# Patient Record
Sex: Female | Born: 1990 | Race: Black or African American | Hispanic: No | Marital: Married | Smoking: Current every day smoker
Health system: Southern US, Community
[De-identification: ages and names within clinical notes are randomized; demographics above are authoritative.]

## PROBLEM LIST (undated history)

## (undated) DIAGNOSIS — O139 Gestational [pregnancy-induced] hypertension without significant proteinuria, unspecified trimester: Secondary | ICD-10-CM

## (undated) DIAGNOSIS — E079 Disorder of thyroid, unspecified: Secondary | ICD-10-CM

## (undated) DIAGNOSIS — E669 Obesity, unspecified: Secondary | ICD-10-CM

## (undated) DIAGNOSIS — O169 Unspecified maternal hypertension, unspecified trimester: Secondary | ICD-10-CM

## (undated) DIAGNOSIS — I10 Essential (primary) hypertension: Secondary | ICD-10-CM

## (undated) HISTORY — PX: UMBILICAL HERNIA REPAIR: SHX196

## (undated) HISTORY — PX: HERNIA REPAIR: SHX51

---
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
## 2017-06-13 ENCOUNTER — Emergency Department
Admission: EM | Admit: 2017-06-13 | Discharge: 2017-06-13 | Disposition: A | Discharge status: Home / Self Care | Payer: Medicaid Other | Attending: Emergency Medicine | Admitting: Emergency Medicine

## 2017-06-13 DIAGNOSIS — Y92410 Unspecified street and highway as the place of occurrence of the external cause: Secondary | ICD-10-CM | POA: Insufficient documentation

## 2017-06-13 DIAGNOSIS — Y939 Activity, unspecified: Secondary | ICD-10-CM | POA: Insufficient documentation

## 2017-06-13 DIAGNOSIS — Z3A09 9 weeks gestation of pregnancy: Secondary | ICD-10-CM | POA: Diagnosis not present

## 2017-06-13 DIAGNOSIS — M545 Low back pain: Secondary | ICD-10-CM | POA: Diagnosis present

## 2017-06-13 DIAGNOSIS — Z041 Encounter for examination and observation following transport accident: Secondary | ICD-10-CM | POA: Insufficient documentation

## 2017-06-13 DIAGNOSIS — Y999 Unspecified external cause status: Secondary | ICD-10-CM | POA: Diagnosis not present

## 2017-06-13 NOTE — ED Notes (Signed)
Pt asked to use phone. Instructed there is a phone in lobby over by officer. Pt ambulated from flex wait to main wait without any distress noted.
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
## 2017-06-13 NOTE — ED Notes (Signed)
Pt alert and oriented X4, active, cooperative, pt in NAD. RR even and unlabored, color WNL.  Pt informed to return if any life threatening symptoms occur.   
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
## 2017-09-07 DIAGNOSIS — E0590 Thyrotoxicosis, unspecified without thyrotoxic crisis or storm: Secondary | ICD-10-CM | POA: Insufficient documentation

## 2017-10-25 DIAGNOSIS — F53 Postpartum depression: Secondary | ICD-10-CM

## 2017-10-25 DIAGNOSIS — O1495 Unspecified pre-eclampsia, complicating the puerperium: Secondary | ICD-10-CM

## 2017-10-25 HISTORY — DX: Postpartum depression: F53.0

## 2017-10-25 HISTORY — DX: Unspecified pre-eclampsia, complicating the puerperium: O14.95

## 2017-11-18 DIAGNOSIS — R519 Headache, unspecified: Secondary | ICD-10-CM | POA: Insufficient documentation

## 2017-12-02 DIAGNOSIS — E221 Hyperprolactinemia: Secondary | ICD-10-CM | POA: Insufficient documentation

## 2017-12-29 DIAGNOSIS — O163 Unspecified maternal hypertension, third trimester: Secondary | ICD-10-CM | POA: Insufficient documentation

## 2017-12-30 DIAGNOSIS — O1490 Unspecified pre-eclampsia, unspecified trimester: Secondary | ICD-10-CM

## 2018-09-16 ENCOUNTER — Emergency Department
Admission: EM | Admit: 2018-09-16 | Discharge: 2018-09-16 | Disposition: A | Discharge status: Home / Self Care | Payer: Medicaid Other | Attending: Emergency Medicine | Admitting: Emergency Medicine

## 2018-09-16 ENCOUNTER — Other Ambulatory Visit: Payer: Self-pay

## 2018-09-16 ENCOUNTER — Encounter: Payer: Self-pay | Admitting: Emergency Medicine

## 2018-09-16 DIAGNOSIS — R2232 Localized swelling, mass and lump, left upper limb: Secondary | ICD-10-CM | POA: Diagnosis present

## 2018-09-16 DIAGNOSIS — B9562 Methicillin resistant Staphylococcus aureus infection as the cause of diseases classified elsewhere: Secondary | ICD-10-CM | POA: Diagnosis not present

## 2018-09-16 DIAGNOSIS — A4902 Methicillin resistant Staphylococcus aureus infection, unspecified site: Secondary | ICD-10-CM

## 2018-09-16 DIAGNOSIS — L02412 Cutaneous abscess of left axilla: Secondary | ICD-10-CM | POA: Diagnosis not present

## 2018-09-16 LAB — GLUCOSE, CAPILLARY: Glucose-Capillary: 113 mg/dL — ABNORMAL HIGH (ref 70–99)

## 2018-09-16 MED ORDER — MUPIROCIN 2 % EX OINT: TOPICAL_OINTMENT | CUTANEOUS | 0 refills | Status: DC

## 2018-09-16 MED ORDER — LIDOCAINE-EPINEPHRINE 2 %-1:100000 IJ SOLN
20.0000 mL | Freq: Once | INTRAMUSCULAR | Status: AC
  Administered 2018-09-16: 20 mL via INTRADERMAL

## 2018-09-16 MED ORDER — LIDOCAINE-EPINEPHRINE 2 %-1:100000 IJ SOLN
INTRAMUSCULAR | Status: AC
  Administered 2018-09-16: 20 mL via INTRADERMAL
  Filled 2018-09-16: qty 1

## 2018-09-16 MED ORDER — IBUPROFEN 600 MG PO TABS: 600.0000 mg | ORAL_TABLET | Freq: Three times a day (TID) | ORAL | 0 refills | Status: DC | PRN

## 2018-09-16 NOTE — Discharge Instructions (Signed)
Please purchase over the counter HIBICLENS and wash with it twice over your entire body over the next week.  Also put the Bactroban ointment in both of your nostrils twice a day for the next 5 days to help eradicate the staph infection from your body.  Follow-up with your primary care physician this coming Monday for recheck and return to the emergency department sooner for any concerns.  It was a pleasure to take care of you today, and thank you for coming to our emergency department.  If you have any questions or concerns before leaving please ask the nurse to grab me and I'm more than happy to go through your aftercare instructions again.  If you have any concerns once you are home that you are not improving or are in fact getting worse before you can make it to your follow-up appointment, please do not hesitate to call 911 and come back for further evaluation.  Merrily BrittleNeil Charmayne Odell, MD

## 2018-09-16 NOTE — ED Provider Notes (Signed)
[facility] Emergency Department Provider Note  ____________________________________________   First MD Initiated Contact with Patient 09/16/18 0119     (approximate)  I have reviewed the triage vital signs and the nursing notes.   HISTORY  Chief Complaint Abscess   HPI Janice Mccall is a 27 y.o. female Janice Mccall presents the emergency department with painful swelling to her left axilla for the past several days.  Symptoms were gradual onset slowly progressive are now moderate to severe.  Pain is worse with movement or touching the area and improved when elevating her arm and when not.  She did have a previous "boil" in her right axilla 1 month ago that was not lanced been given antibiotics by a midlevel practitioner at urgent care.  The patient said she subsequently "popped it" at home.  No history of hidradenitis.  No history of diabetes mellitus.  No fevers or chills.    History reviewed. No pertinent past medical history.  There are no active problems to display for this patient.   History reviewed. No pertinent surgical history.  Prior to Admission medications   Medication Sig Start Date End Date Taking? Authorizing Provider  ibuprofen (ADVIL,MOTRIN) 600 MG tablet Take 1 tablet (600 mg total) by mouth every 8 (eight) hours as needed. 09/16/18   Merrily Brittle, MD  mupirocin ointment (BACTROBAN) 2 % Please apply a small amount of ointment to each nostril twice a day for the next 5 days to eradicate the staph infection 09/16/18 09/16/19  Merrily Brittle, MD    Allergies Patient has no known allergies.  No family history on file.  Social History Social History   Tobacco Use  . Smoking status: Never Smoker  . Smokeless tobacco: Never Used  Substance Use Topics  . Alcohol use: No  . Drug use: Never    Review of Systems Constitutional: No fever/chills Cardiovascular: Denies chest pain. Respiratory: Denies shortness of  breath. Musculoskeletal: Positive for axilla pain Skin: Positive for swelling and redness   ____________________________________________   PHYSICAL EXAM:  VITAL SIGNS: ED Triage Vitals  Enc Vitals Group     BP      Pulse      Resp      Temp      Temp src      SpO2      Weight      Height      Head Circumference      Peak Flow      Pain Score      Pain Loc      Pain Edu?      Excl. in GC?     Constitutional: Alert and oriented x4 appears obviously uncomfortable although nontoxic no diaphoresis and speaks in full clear sentences Cardiovascular: Tachycardic regular rhythm Respiratory: Normal respiratory effort.  No retractions. Neurologic:  Normal speech and language. No gross focal neurologic deficits are appreciated.  Skin: 8 centimeter indurated and fluctuant abscess in her anterior left axilla with slight overlying erythema but no overt cellulitis.  No signs of necrotizing soft tissue infection    ____________________________________________  LABS (all labs ordered are listed, but only abnormal results are displayed)  Labs Reviewed  GLUCOSE, CAPILLARY - Abnormal; Notable for the following components:      Result Value   Glucose-Capillary 113 (*)    All other components within normal limits    Lab work reviewed by me not consistent with diabetes mellitus __________________________________________  EKG   ____________________________________________  RADIOLOGY  ____________________________________________   DIFFERENTIAL includes but not limited to  Abscess, cellulitis, hidradenitis, diabetes mellitus, necrotizing soft tissue infection   PROCEDURES  Procedure(s) performed: Yes  .Marland Kitchen.Incision and Drainage Date/Time: 09/16/2018 1:30 AM Performed by: Merrily Brittleifenbark, Lani Mendiola, MD Authorized by: Merrily Brittleifenbark, Oluwatoni Rotunno, MD   Consent:    Consent obtained:  Verbal   Consent given by:  Patient   Risks discussed:  Bleeding, incomplete drainage, infection and pain    Alternatives discussed:  Alternative treatment Location:    Type:  Abscess   Size:  8cm   Location:  Upper extremity   Upper extremity location: Left axilla. Pre-procedure details:    Skin preparation:  Chloraprep Anesthesia (see MAR for exact dosages):    Anesthesia method:  Local infiltration   Local anesthetic:  Lidocaine 2% WITH epi Procedure details:    Needle aspiration: no     Incision types:  Single straight   Scalpel blade:  11   Drainage:  Purulent   Drainage amount:  Moderate   Wound treatment:  Wound left open   Packing materials:  None Post-procedure details:    Patient tolerance of procedure:  Tolerated well, no immediate complications    Critical Care performed: no  ____________________________________________   INITIAL IMPRESSION / ASSESSMENT AND PLAN / ED COURSE  Pertinent labs & imaging results that were available during my care of the patient were reviewed by me and considered in my medical decision making (see chart for details).   As part of my medical decision making, I reviewed the following data within the electronic MEDICAL RECORD NUMBER History obtained from family if available, nursing notes, old chart and ekg, as well as notes from prior ED visits.  The patient comes to the emergency department with an abscess to her left axilla.  This is her second abscess in 1 month so I checked a blood sugar which is not consistent with diabetes mellitus.  I obtained verbal consent to drain the abscess and performed it with chlorhexidine cleansing and lidocaine block expressing a large amount of purulent material.  No indication for antibiotics.  I have advised her to use Hibiclens as she is likely colonized with MRSA and I will give her Bactroban for decontamination of her naris.  2-day wound check given.  Strict return precautions and primary care follow-up.      ____________________________________________   FINAL CLINICAL IMPRESSION(S) / ED DIAGNOSES  Final  diagnoses:  Abscess of left axilla  MRSA (methicillin resistant Staphylococcus aureus)      NEW MEDICATIONS STARTED DURING THIS VISIT:  Discharge Medication List as of 09/16/2018  1:52 AM    START taking these medications   Details  ibuprofen (ADVIL,MOTRIN) 600 MG tablet Take 1 tablet (600 mg total) by mouth every 8 (eight) hours as needed., Starting Sat 09/16/2018, Print    mupirocin ointment (BACTROBAN) 2 % Please apply a small amount of ointment to each nostril twice a day for the next 5 days to eradicate the staph infection, Print         Note:  This document was prepared using Dragon voice recognition software and may include unintentional dictation errors.      Merrily Brittleifenbark, Elisha Mcgruder, MD 09/16/18 2219

## 2018-09-16 NOTE — ED Triage Notes (Signed)
Pt presents ambulatory to ED with c/o left arm axillary abscess. Pt has noted abscess to left axillary area at this time. Pt is in NAD.

## 2018-09-23 ENCOUNTER — Emergency Department
Admission: EM | Admit: 2018-09-23 | Discharge: 2018-09-24 | Disposition: A | Discharge status: Home / Self Care | Payer: No Typology Code available for payment source | Attending: Emergency Medicine | Admitting: Emergency Medicine

## 2018-09-23 ENCOUNTER — Other Ambulatory Visit: Payer: Self-pay

## 2018-09-23 ENCOUNTER — Encounter: Payer: Self-pay | Admitting: Emergency Medicine

## 2018-09-23 DIAGNOSIS — R45851 Suicidal ideations: Secondary | ICD-10-CM | POA: Insufficient documentation

## 2018-09-23 DIAGNOSIS — F32A Depression, unspecified: Secondary | ICD-10-CM

## 2018-09-23 DIAGNOSIS — F329 Major depressive disorder, single episode, unspecified: Secondary | ICD-10-CM

## 2018-09-23 DIAGNOSIS — F53 Postpartum depression: Secondary | ICD-10-CM | POA: Diagnosis present

## 2018-09-23 LAB — COMPREHENSIVE METABOLIC PANEL
ALBUMIN: 4.5 g/dL (ref 3.5–5.0)
ALT: 12 U/L (ref 0–44)
AST: 20 U/L (ref 15–41)
Alkaline Phosphatase: 134 U/L — ABNORMAL HIGH (ref 38–126)
Anion gap: 10 (ref 5–15)
BUN: 16 mg/dL (ref 6–20)
CHLORIDE: 104 mmol/L (ref 98–111)
CO2: 27 mmol/L (ref 22–32)
Calcium: 9.8 mg/dL (ref 8.9–10.3)
Creatinine, Ser: 0.67 mg/dL (ref 0.44–1.00)
GFR calc Af Amer: >60 mL/min (ref >60)
GFR calc non Af Amer: >60 mL/min (ref >60)
GLUCOSE: 119 mg/dL — AB (ref 70–99)
POTASSIUM: 4.2 mmol/L (ref 3.5–5.1)
Sodium: 141 mmol/L (ref 135–145)
Total Bilirubin: 0.9 mg/dL (ref 0.3–1.2)
Total Protein: 8.2 g/dL — ABNORMAL HIGH (ref 6.5–8.1)

## 2018-09-23 LAB — CBC
HEMATOCRIT: 40.6 % (ref 36.0–46.0)
HEMOGLOBIN: 13.1 g/dL (ref 12.0–15.0)
MCH: 29.8 pg (ref 26.0–34.0)
MCHC: 32.3 g/dL (ref 30.0–36.0)
MCV: 92.3 fL (ref 80.0–100.0)
NRBC: 0 % (ref 0.0–0.2)
Platelets: 215 10*3/uL (ref 150–400)
RBC: 4.4 MIL/uL (ref 3.87–5.11)
RDW: 12.5 % (ref 11.5–15.5)
WBC: 10.1 10*3/uL (ref 4.0–10.5)

## 2018-09-23 LAB — ACETAMINOPHEN LEVEL: Acetaminophen (Tylenol), Serum: <10 ug/mL — ABNORMAL LOW (ref 10–30)

## 2018-09-23 LAB — ETHANOL: Alcohol, Ethyl (B): <10 mg/dL (ref <10)

## 2018-09-23 LAB — SALICYLATE LEVEL: Salicylate Lvl: <7 mg/dL (ref 2.8–30.0)

## 2018-09-23 MED ORDER — DIPHENHYDRAMINE HCL 50 MG/ML IJ SOLN
INTRAMUSCULAR | Status: AC
  Administered 2018-09-23: 50 mg via INTRAMUSCULAR
  Filled 2018-09-23: qty 1

## 2018-09-23 MED ORDER — DIPHENHYDRAMINE HCL 50 MG/ML IJ SOLN
50.0000 mg | Freq: Once | INTRAMUSCULAR | Status: AC
  Administered 2018-09-23: 50 mg via INTRAMUSCULAR

## 2018-09-23 MED ORDER — HALOPERIDOL LACTATE 5 MG/ML IJ SOLN
5.0000 mg | Freq: Once | INTRAMUSCULAR | Status: AC
  Administered 2018-09-23: 5 mg via INTRAMUSCULAR

## 2018-09-23 MED ORDER — HALOPERIDOL LACTATE 5 MG/ML IJ SOLN
INTRAMUSCULAR | Status: AC
  Administered 2018-09-23: 5 mg via INTRAMUSCULAR
  Filled 2018-09-23: qty 1

## 2018-09-23 MED ORDER — LORAZEPAM 2 MG/ML IJ SOLN
INTRAMUSCULAR | Status: AC
  Administered 2018-09-23: 2 mg via INTRAMUSCULAR
  Filled 2018-09-23: qty 1

## 2018-09-23 MED ORDER — LORAZEPAM 2 MG/ML IJ SOLN
2.0000 mg | Freq: Once | INTRAMUSCULAR | Status: AC
  Administered 2018-09-23: 2 mg via INTRAMUSCULAR

## 2018-09-23 NOTE — ED Notes (Signed)
Pt. Asking to go home.

## 2018-09-23 NOTE — ED Provider Notes (Signed)
[facility] Emergency Department Provider Note   ____________________________________________   I have reviewed the triage vital signs and the nursing notes.   HISTORY  Chief Complaint Suicidal   History and exam limited by and level 5 caveat due to: Not cooperative   HPI Janice Mccall is a 27 y.o. female who presents to the emergency department today under IVC because of concerns for suicidal ideation.  My exam the patient is yelling.  She states she does not want to be here.  She states she feels like she does not need to be here.  She did state that she contacted a suicide prevention line however states that she is better now.  Otherwise she is not forthcoming with information.   Per medical record review patient has a history of recent ER visit for abscess  History reviewed. No pertinent past medical history.  There are no active problems to display for this patient.   Past Surgical History:  Procedure Laterality Date  . HERNIA REPAIR      Prior to Admission medications   Medication Sig Start Date End Date Taking? Authorizing Provider  ibuprofen (ADVIL,MOTRIN) 600 MG tablet Take 1 tablet (600 mg total) by mouth every 8 (eight) hours as needed. 09/16/18   Darel Hong, MD  mupirocin ointment (BACTROBAN) 2 % Please apply a small amount of ointment to each nostril twice a day for the next 5 days to eradicate the staph infection 09/16/18 09/16/19  Darel Hong, MD    Allergies Patient has no known allergies.  No family history on file.  Social History Social History   Tobacco Use  . Smoking status: Never Smoker  . Smokeless tobacco: Never Used  Substance Use Topics  . Alcohol use: No  . Drug use: Never    Review of Systems Patient refusing to answer questions ____________________________________________   PHYSICAL EXAM:  VITAL SIGNS: ED Triage Vitals  Enc Vitals Group     BP 09/23/18 1817 126/75     Pulse Rate 09/23/18  1817 (!) 108     Resp 09/23/18 1817 16     Temp 09/23/18 1817 98.7 F (37.1 C)     Temp Source 09/23/18 1817 Oral     SpO2 09/23/18 1817 97 %     Weight 09/23/18 1814 213 lb 13.5 oz (97 kg)     Height 09/23/18 1814 5' 3"  (1.6 m)     Head Circumference --      Peak Flow --      Pain Score 09/23/18 1814 0   Constitutional: Awake, alert, yelling.  Eyes: Conjunctivae are normal.  ENT      Head: Normocephalic and atraumatic.      Nose: No congestion/rhinnorhea.      Mouth/Throat: Mucous membranes are moist.      Neck: No stridor. Respiratory: Normal respiratory effort without tachypnea nor retractions. Genitourinary: Deferred Musculoskeletal: Normal range of motion in all extremities. No lower extremity edema. Neurologic:  Normal speech and language. No gross focal neurologic deficits are appreciated.  Skin:  Skin is warm, dry and intact. No rash noted. Psychiatric: Yelling ____________________________________________    LABS (pertinent positives/negatives)  CBC wbc 10.1, hgb 13.1, plt 215 CMP wnl except glu 119, t pro 8.2, alk phos 134 Acetaminophen, ethanol, salicylate below threshold  ____________________________________________   EKG  None  ____________________________________________    RADIOLOGY  None  ____________________________________________   PROCEDURES  Procedures  ____________________________________________   INITIAL IMPRESSION / ASSESSMENT AND PLAN / ED  COURSE  Pertinent labs & imaging results that were available during my care of the patient were reviewed by me and considered in my medical decision making (see chart for details).   Patient presented to the emergency department today under IVC.  My exam was limited given that the patient was yelling.  She was quite Administrator, [employer].  Given the level of agitation I did asked the nursing staff give her some medications to help calm her down.  Will have specialist on-call  evaluate.   ____________________________________________   FINAL CLINICAL IMPRESSION(S) / ED DIAGNOSES  Suicidal ideation  Note: This dictation was prepared with Dragon dictation. Any transcriptional errors that result from this process are unintentional     Nance Pear, MD 09/23/18 2322

## 2018-09-23 NOTE — ED Notes (Signed)
Pt. Started arguing with this nurse.  Pt. Would keep repeating herself yelling "I want to go the fuck home".  "You can't keep me here".  Both this nurse and MD tried to reason with patient.  Pt. Continues to yell, pt. Walked into bathroom.  This nurse, another nurse(Sherri) and two officers went into bathroom where pt. Was sitting on floor.  Pt. Redirected to room #23.  Pt. Took medication without restraint.
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
## 2018-09-23 NOTE — ED Notes (Signed)
Pt. Given meal tray upon request.

## 2018-09-23 NOTE — ED Notes (Signed)
Pt dressed out by this RN and Lelon MastSamantha, RN, pt has the following belongings with her  1 silver metal ring with a black stone 2 black hair ties 1 multi-color rubber bracelet  1 pair of grey boots 1 pair of grey leggings 1 black coat 1 dark blue and white stripped shirt  1 pink and black bra 1 pair of pink and black socks 1 pair of pink and white underwear

## 2018-09-23 NOTE — ED Notes (Signed)
Patient assigned to appropriate care area   Introduced self to pt  Patient oriented to unit/care area: Informed that, for their safety, care areas are designed for safety and visiting and phone hours explained to patient. Patient verbalizes understanding, and verbal contract for safety obtained Environment secured    Pt to ED with [place] [employer] under IVC for SI. Pt is tearful in triage stating that she was having thoughts of harming herself due to everything that has been going on recently. Pt states that her daughter was recently taken away from her. Pt informed officers that she had blood pressure medication that she was planning to take to kill herself. Pt is cooperative at this time.

## 2018-09-23 NOTE — ED Notes (Signed)
Spoke to patient's husband in lobby.  Husband states patient was text ing an unknown phone # in which she suggested that she wanted to end her life.  Husband states patient was dx with post-partum depression.  Husband also reports child was taken away from family and now lives with patients mother.  Husband also reports he spoke to his wife two days ago about possible divorce, due to patient having unknown numbers on her phone who she has been texting.  When I spoke to husband, husband says he does not want divorce his wife just wants her to stop texting people he does not know.

## 2018-09-23 NOTE — ED Triage Notes (Signed)
[facility] [place]. Pt is tearful in triage stating that she was having thoughts of harming herself due to everything that has been going on recently. Pt states that her daughter was recently taken away from her. Pt informed officers that she had blood pressure medication that she was planning to take to kill herself. Pt is cooperative at this time.

## 2018-09-24 NOTE — ED Provider Notes (Signed)
-----------------------------------------   6:41 AM on 09/24/2018 -----------------------------------------   Blood pressure 126/75, pulse (!) 108, temperature 98.7 F (37.1 C), temperature source Oral, resp. rate 16, height 5\' 3"  (1.6 m), weight 97 kg, last menstrual period 09/08/2018, SpO2 97 %.  The patient had no acute events since last update.  Calm and cooperative at this time.  Disposition is pending Psychiatry/Behavioral Medicine team recommendations.     Irean HongSung, Franki Stemen J, MD 09/24/18 [phone]

## 2018-09-24 NOTE — ED Notes (Signed)
[facility] and She states that she is going to discharge her from hospital.

## 2018-09-24 NOTE — Discharge Instructions (Addendum)
You have been seen in the emergency department for a  psychiatric concern. You have been evaluated both medically as well as psychiatrically. Please follow-up with your outpatient resources provided. Return to the emergency department for any worsening symptoms, or any thoughts of hurting yourself or anyone else so that we may attempt to help you. 

## 2018-09-24 NOTE — ED Notes (Signed)
Soc in progress at this time.  

## 2018-09-24 NOTE — ED Provider Notes (Signed)
-----------------------------------------   9:29 AM on 09/24/2018 -----------------------------------------  Patient has been seen by psychiatry, they believe the patient is safe for discharge from a psychiatric standpoint.  Patient's medical work-up is been largely nonrevealing.  Patient will be discharged from the emergency department with outpatient resources.   Minna AntisPaduchowski, Garima Chronis, MD 09/24/18 [phone]

## 2018-09-24 NOTE — ED Notes (Signed)
Patient voices understanding of discharge instructions, all belongings given back to Patient, she is dressed, no signs of distress and husband to transfer home.

## 2019-01-10 ENCOUNTER — Other Ambulatory Visit: Payer: Self-pay

## 2019-01-10 ENCOUNTER — Encounter: Payer: Self-pay | Admitting: Emergency Medicine

## 2019-01-10 ENCOUNTER — Emergency Department
Admission: EM | Admit: 2019-01-10 | Discharge: 2019-01-10 | Disposition: A | Discharge status: Home / Self Care | Payer: Medicaid Other | Attending: Emergency Medicine | Admitting: Emergency Medicine

## 2019-01-10 DIAGNOSIS — G43909 Migraine, unspecified, not intractable, without status migrainosus: Secondary | ICD-10-CM | POA: Diagnosis not present

## 2019-01-10 DIAGNOSIS — R51 Headache: Secondary | ICD-10-CM | POA: Diagnosis present

## 2019-01-10 MED ORDER — KETOROLAC TROMETHAMINE 30 MG/ML IJ SOLN
30.0000 mg | Freq: Once | INTRAMUSCULAR | Status: AC
  Administered 2019-01-10: 30 mg via INTRAMUSCULAR
  Filled 2019-01-10: qty 1

## 2019-01-10 MED ORDER — METOCLOPRAMIDE HCL 5 MG PO TABS: 5.0000 mg | ORAL_TABLET | Freq: Three times a day (TID) | ORAL | 0 refills | Status: DC | PRN

## 2019-01-10 MED ORDER — DIPHENHYDRAMINE HCL 25 MG PO CAPS
50.0000 mg | ORAL_CAPSULE | Freq: Once | ORAL | Status: AC
  Administered 2019-01-10: 50 mg via ORAL
  Filled 2019-01-10: qty 2

## 2019-01-10 MED ORDER — KETOROLAC TROMETHAMINE 10 MG PO TABS: 10.0000 mg | ORAL_TABLET | Freq: Three times a day (TID) | ORAL | 0 refills | Status: DC

## 2019-01-10 MED ORDER — METOCLOPRAMIDE HCL 10 MG PO TABS
10.0000 mg | ORAL_TABLET | Freq: Once | ORAL | Status: AC
  Administered 2019-01-10: 10 mg via ORAL
  Filled 2019-01-10: qty 1

## 2019-01-10 NOTE — ED Triage Notes (Signed)
Pt states she had migraines when she was younger, took medicine for them and they went away. Past 3 days she's been nauseated, sensitive to light, and "whole head" pain. Appears in NAD.

## 2019-01-10 NOTE — ED Provider Notes (Signed)
[facility] Emergency Department Provider Note ____________________________________________  Time seen: 1455  I have reviewed the triage vital signs and the nursing notes.  HISTORY  Chief Complaint  Migraine  HPI Janice Mccall is a 28 y.o. female presents to the ED for evaluation management of a 3-day complaint of headache.  Patient with a remote history of migraines reports symptoms are similar to previous migraines.  She describes her last recorded migraine was probably in 2014.  She was never placed on medications for prevention or abortion of her headaches.  She reports she has taken 1000 mg of Tylenol  X 1 dose for symptom relief.  She reports nausea but denies any vomiting, reports light sensitivity, but denies any syncope, vision loss, weakness, or chest pain.  She also denies any recent illness, trauma, or other known triggers.  History reviewed. No pertinent past medical history.  There are no active problems to display for this patient.   Past Surgical History:  Procedure Laterality Date  . HERNIA REPAIR      Prior to Admission medications   Medication Sig Start Date End Date Taking? Authorizing Provider  ibuprofen (ADVIL,MOTRIN) 600 MG tablet Take 1 tablet (600 mg total) by mouth every 8 (eight) hours as needed. 09/16/18   Merrily Brittle, MD  mupirocin ointment (BACTROBAN) 2 % Please apply a small amount of ointment to each nostril twice a day for the next 5 days to eradicate the staph infection 09/16/18 09/16/19  Merrily Brittle, MD    Allergies Patient has no known allergies.  No family history on file.  Social History Social History   Tobacco Use  . Smoking status: Never Smoker  . Smokeless tobacco: Never Used  Substance Use Topics  . Alcohol use: No  . Drug use: Never    Review of Systems  Constitutional: Negative for fever. Eyes: Negative for visual changes.  Reports light sensitivity. ENT: Negative for sore  throat. Cardiovascular: Negative for chest pain. Respiratory: Negative for shortness of breath. Gastrointestinal: Negative for abdominal pain, vomiting and diarrhea. Genitourinary: Negative for dysuria. Musculoskeletal: Negative for back pain. Skin: Negative for rash. Neurological: Negative for focal weakness or numbness.  Reports headache as above. ____________________________________________  PHYSICAL EXAM:  VITAL SIGNS: ED Triage Vitals  Enc Vitals Group     BP 01/10/19 1401 130/61     Pulse Rate 01/10/19 1401 93     Resp 01/10/19 1401 16     Temp 01/10/19 1401 98.1 F (36.7 C)     Temp Source 01/10/19 1401 Oral     SpO2 01/10/19 1401 99 %     Weight 01/10/19 1357 198 lb (89.8 kg)     Height 01/10/19 1357 5\' 2"  (1.575 m)     Head Circumference --      Peak Flow --      Pain Score 01/10/19 1357 10     Pain Loc --      Pain Edu? --      Excl. in GC? --     Constitutional: Alert and oriented. Well appearing and in no distress. Head: Normocephalic and atraumatic. Eyes: Conjunctivae are normal. PERRL. Normal extraocular movements and fundi bilaterally. Ears: Canals clear. TMs intact bilaterally. Nose: No congestion/rhinorrhea/epistaxis. Mouth/Throat: Mucous membranes are moist.  Uvula is midline and tonsils are flat. Neck: Supple. No thyromegaly.  Normal range of motion without crepitus. Hematological/Lymphatic/Immunological: No cervical lymphadenopathy. Cardiovascular: Normal rate, regular rhythm. Normal distal pulses. Respiratory: Normal respiratory effort. No wheezes/rales/rhonchi. Musculoskeletal: Nontender with  normal range of motion in all extremities.  Neurologic: Cranial nerves II through XII grossly intact.  Normal gait without ataxia. Normal speech and language. No gross focal neurologic deficits are appreciated. Skin:  Skin is warm, dry and intact. No rash noted. Psychiatric: Mood and affect are normal. Patient exhibits appropriate insight and  judgment. ____________________________________________  PROCEDURES  Procedures Toradol 30 mg IM Reglan 10 mg p.o. Benadryl 50 mg p.o. ____________________________________________  INITIAL IMPRESSION / ASSESSMENT AND PLAN / ED COURSE  Patient with ED evaluation of headache pain described as a typical migraine for her.  She has not had a migraine in several years, but denies any known trigger or syncope.  She had reports improvement of her symptoms after ED administration of anti-inflammatory, nausea medicine, and Benadryl.  She will be discharged with prescriptions for Reglan and Toradol.  Work is provided for 1 day as requested. ____________________________________________  FINAL CLINICAL IMPRESSION(S) / ED DIAGNOSES  Final diagnoses:  Migraine without status migrainosus, not intractable, unspecified migraine type      Karmen Stabs, Charlesetta Ivory, PA-C 01/10/19 1559    Rockne Menghini, MD 01/10/19 2041

## 2019-01-10 NOTE — ED Notes (Signed)
PT states that things smell weird like when she was pregnant-has not taken pregnancy test

## 2019-01-10 NOTE — Discharge Instructions (Signed)
Your exam is normal and your have responded well to the medicines to resolve your headache. Take the prescription meds as directed. Follow-up with your provider or a local community clinic for ongoing symptoms.

## 2019-01-12 ENCOUNTER — Emergency Department
Admission: EM | Admit: 2019-01-12 | Discharge: 2019-01-12 | Disposition: A | Discharge status: Home / Self Care | Payer: Medicaid Other | Attending: Emergency Medicine | Admitting: Emergency Medicine

## 2019-01-12 ENCOUNTER — Encounter: Payer: Self-pay | Admitting: Emergency Medicine

## 2019-01-12 ENCOUNTER — Other Ambulatory Visit: Payer: Self-pay

## 2019-01-12 DIAGNOSIS — I10 Essential (primary) hypertension: Secondary | ICD-10-CM | POA: Diagnosis not present

## 2019-01-12 DIAGNOSIS — M791 Myalgia, unspecified site: Secondary | ICD-10-CM | POA: Diagnosis present

## 2019-01-12 DIAGNOSIS — F17200 Nicotine dependence, unspecified, uncomplicated: Secondary | ICD-10-CM | POA: Diagnosis not present

## 2019-01-12 DIAGNOSIS — A084 Viral intestinal infection, unspecified: Secondary | ICD-10-CM | POA: Diagnosis not present

## 2019-01-12 HISTORY — DX: Essential (primary) hypertension: I10

## 2019-01-12 MED ORDER — ONDANSETRON 4 MG PO TBDP: 4.0000 mg | ORAL_TABLET | Freq: Three times a day (TID) | ORAL | 0 refills | Status: DC | PRN

## 2019-01-12 MED ORDER — BUTALBITAL-APAP-CAFFEINE 50-325-40 MG PO TABS
1.0000 | ORAL_TABLET | Freq: Once | ORAL | Status: AC
  Administered 2019-01-12: 1 via ORAL
  Filled 2019-01-12: qty 1

## 2019-01-12 MED ORDER — ONDANSETRON 8 MG PO TBDP
8.0000 mg | ORAL_TABLET | Freq: Once | ORAL | Status: AC
  Administered 2019-01-12: 8 mg via ORAL
  Filled 2019-01-12: qty 1

## 2019-01-12 NOTE — ED Triage Notes (Signed)
Pt to ED via POV c/o Headaches and nausea. Pt seen 2 days ago for same. States that symptoms aren't any better. Pt states that she has not taken any medications for her symptoms. Pt is in NAD.

## 2019-01-12 NOTE — ED Provider Notes (Signed)
[facility] Emergency Department Provider Note  ____________________________________________  Time seen: Approximately 4:32 PM  I have reviewed the triage vital signs and the nursing notes.   HISTORY  Chief Complaint Headache and Generalized Body Aches    HPI Janice Mccall is a 28 y.o. female who presents the emergency department complaining of ongoing headache, nausea, vomiting, diarrhea.  Patient was seen in this department 2 days ago for headache, at that time that was her only symptom.  Patient states that yesterday she had a return of symptoms to include nausea, vomiting, headache, diarrhea.  No fevers or chills.  No nasal congestion, sore throat, cough.  She denies any abdominal pain.  She has not tried any medications for his complaint.  She was prescribed medication from the emergency department but states "they were too expensive for me to fill."         Past Medical History:  Diagnosis Date  . Hypertension     There are no active problems to display for this patient.   Past Surgical History:  Procedure Laterality Date  . HERNIA REPAIR      Prior to Admission medications   Medication Sig Start Date End Date Taking? Authorizing Provider  ketorolac (TORADOL) 10 MG tablet Take 1 tablet (10 mg total) by mouth every 8 (eight) hours. 01/10/19   Menshew, Charlesetta Ivory, PA-C  metoCLOPramide (REGLAN) 5 MG tablet Take 1 tablet (5 mg total) by mouth every 8 (eight) hours as needed for nausea or vomiting. 01/10/19   Menshew, Charlesetta Ivory, PA-C  ondansetron (ZOFRAN-ODT) 4 MG disintegrating tablet Take 1 tablet (4 mg total) by mouth every 8 (eight) hours as needed for nausea or vomiting. 01/12/19   , Delorise Royals, PA-C    Allergies Patient has no known allergies.  No family history on file.  Social History Social History   Tobacco Use  . Smoking status: Current Every Day Smoker  . Smokeless tobacco: Never Used  Substance Use Topics  .  Alcohol use: No  . Drug use: Never     Review of Systems  Constitutional: No fever/chills Eyes: No visual changes.  Cardiovascular: no chest pain. Respiratory: no cough. No SOB. Gastrointestinal: No abdominal pain.  Positive for nausea, vomiting, diarrhea..  No constipation. Genitourinary: Negative for dysuria. No hematuria Musculoskeletal: Negative for musculoskeletal pain. Skin: Negative for rash, abrasions, lacerations, ecchymosis. Neurological: Positive for headache but denies focal weakness or numbness. 10-point ROS otherwise negative.  ____________________________________________   PHYSICAL EXAM:  VITAL SIGNS: ED Triage Vitals  Enc Vitals Group     BP 01/12/19 1627 104/72     Pulse Rate 01/12/19 1624 100     Resp 01/12/19 1624 16     Temp 01/12/19 1624 99.2 F (37.3 C)     Temp Source 01/12/19 1624 Oral     SpO2 01/12/19 1624 98 %     Weight 01/12/19 1625 198 lb (89.8 kg)     Height 01/12/19 1625 5\' 2"  (1.575 m)     Head Circumference --      Peak Flow --      Pain Score 01/12/19 1625 10     Pain Loc --      Pain Edu? --      Excl. in GC? --      Constitutional: Alert and oriented. Well appearing and in no acute distress. Eyes: Conjunctivae are normal. PERRL. EOMI. Head: Atraumatic. ENT:      Ears:  Nose: No congestion/rhinnorhea.      Mouth/Throat: Mucous membranes are moist.  Oropharynx is nonerythematous and nonedematous.  Uvula is midline. Neck: No stridor.  Neck is supple full range of motion Hematological/Lymphatic/Immunilogical: No cervical lymphadenopathy. Cardiovascular: Normal rate, regular rhythm. Normal S1 and S2.  Good peripheral circulation. Respiratory: Normal respiratory effort without tachypnea or retractions. Lungs CTAB. Good air entry to the bases with no decreased or absent breath sounds. Gastrointestinal: Bowel sounds 4 quadrants. Soft and nontender to palpation. No guarding or rigidity. No palpable masses. No distention. No CVA  tenderness. Musculoskeletal: Full range of motion to all extremities. No gross deformities appreciated. Neurologic:  Normal speech and language. No gross focal neurologic deficits are appreciated.  Skin:  Skin is warm, dry and intact. No rash noted. Psychiatric: Mood and affect are normal. Speech and behavior are normal. Patient exhibits appropriate insight and judgement.   ____________________________________________   LABS (all labs ordered are listed, but only abnormal results are displayed)  Labs Reviewed - No data to display ____________________________________________  EKG   ____________________________________________  RADIOLOGY   No results found.  ____________________________________________    PROCEDURES  Procedure(s) performed:    Procedures    Medications  butalbital-acetaminophen-caffeine (FIORICET, ESGIC) 50-325-40 MG per tablet 1 tablet (has no administration in time range)  ondansetron (ZOFRAN-ODT) disintegrating tablet 8 mg (has no administration in time range)     ____________________________________________   INITIAL IMPRESSION / ASSESSMENT AND PLAN / ED COURSE  Pertinent labs & imaging results that were available during my care of the patient were reviewed by me and considered in my medical decision making (see chart for details).  Review of the [place] CSRS was performed in accordance of the [facility] prior to dispensing any controlled drugs.           Patient's diagnosis is consistent with viral gastroenteritis.  Patient presented to the emergency department 2 days ago with a complaint of headache.  Since then she has developed nausea, vomiting, diarrhea.  No fevers or chills.  No abdominal pain.  Exam is reassuring.  Patient is given Fioricet for headache, Zofran for nausea.  Patient is instructed to use probiotics to help with diarrhea.  Plenty of rest, fluids, discussed food options to help with diarrhea and nausea.  Follow-up primary care as  needed..  Patient will be prescribed Zofran for symptom relief at home.  Patient is given ED precautions to return to the ED for any worsening or new symptoms.     ____________________________________________  FINAL CLINICAL IMPRESSION(S) / ED DIAGNOSES  Final diagnoses:  Viral gastroenteritis      NEW MEDICATIONS STARTED DURING THIS VISIT:  ED Discharge Orders         Ordered    ondansetron (ZOFRAN-ODT) 4 MG disintegrating tablet  Every 8 hours PRN     01/12/19 1650              This chart was dictated using voice recognition software/Dragon. Despite best efforts to proofread, errors can occur which can change the meaning. Any change was purely unintentional.    Racheal Patches, PA-C 01/12/19 1654    Minna Antis, MD 01/12/19 2308

## 2019-01-12 NOTE — ED Notes (Signed)
See triage note  Presents with h/a  States pain is mainly under her eyes and to back of head  States she was seen 2 days ago for same  States she developed diarrhea yesterday  Unsure of fever  Afebrile on arrival

## 2019-03-15 ENCOUNTER — Encounter: Payer: Self-pay | Admitting: Emergency Medicine

## 2019-03-15 ENCOUNTER — Other Ambulatory Visit: Payer: Self-pay

## 2019-03-15 ENCOUNTER — Emergency Department
Admission: EM | Admit: 2019-03-15 | Discharge: 2019-03-15 | Disposition: A | Discharge status: Home / Self Care | Payer: Medicaid Other | Attending: Student in an Organized Health Care Education/Training Program | Admitting: Student in an Organized Health Care Education/Training Program

## 2019-03-15 DIAGNOSIS — I10 Essential (primary) hypertension: Secondary | ICD-10-CM | POA: Insufficient documentation

## 2019-03-15 DIAGNOSIS — F17210 Nicotine dependence, cigarettes, uncomplicated: Secondary | ICD-10-CM | POA: Insufficient documentation

## 2019-03-15 DIAGNOSIS — N3000 Acute cystitis without hematuria: Secondary | ICD-10-CM | POA: Insufficient documentation

## 2019-03-15 DIAGNOSIS — Z3202 Encounter for pregnancy test, result negative: Secondary | ICD-10-CM | POA: Diagnosis present

## 2019-03-15 LAB — URINALYSIS, COMPLETE (UACMP) WITH MICROSCOPIC
Bilirubin Urine: NEGATIVE
Glucose, UA: NEGATIVE mg/dL
Hgb urine dipstick: NEGATIVE
Ketones, ur: NEGATIVE mg/dL
Leukocytes,Ua: NEGATIVE
Nitrite: POSITIVE — AB
Protein, ur: 30 mg/dL — AB
Specific Gravity, Urine: 1.015 (ref 1.005–1.030)
pH: 6 (ref 5.0–8.0)

## 2019-03-15 LAB — GLUCOSE, CAPILLARY: Glucose-Capillary: 74 mg/dL (ref 70–99)

## 2019-03-15 LAB — POCT PREGNANCY, URINE: Preg Test, Ur: NEGATIVE

## 2019-03-15 MED ORDER — CEPHALEXIN 500 MG PO CAPS: 500.0000 mg | ORAL_CAPSULE | Freq: Two times a day (BID) | ORAL | 0 refills | Status: AC

## 2019-03-15 NOTE — ED Notes (Signed)
See triage note  States she has a frontal headache  States headache started about 1 week ago   Also states she fell yesterday  Landed on left leg and states she did not hit her head at that time  But states she did hit her head at work  Denies any fever or trauma  But did have 1 episode of vomiting today

## 2019-03-15 NOTE — ED Triage Notes (Signed)
Pt also states her last cycle was light and short and there is a possibility she could be pregnant. Pt states that she shakes sometimes when she wakes up as well.

## 2019-03-15 NOTE — ED Triage Notes (Signed)
Pt reports yesterday her feet were swollen but today they have went down. Pt also states she slipped yesterday walking down a wet hill and she may have hurt her left leg a little but no obvious injuries. Pt states, "I just feel weird".

## 2019-03-19 NOTE — ED Provider Notes (Signed)
[facility] Emergency Department Provider Note  ____________________________________________  Time seen: Approximately 1:45 PM  I have reviewed the triage vital signs and the nursing notes.   HISTORY  Chief Complaint Fall    HPI Janice Mccall is a 28 y.o. female who presents to the emergency department with multiple complaints. She is mainly here to see if she is pregnant. Her symptoms include light menstrual cycle, shaking for a few minutes when waking, headache, swollen feet yesterday, nausea with one episode of vomiting. She also states that she fell and hurt her left leg yesterday, but today it is just a little sore.   Past Medical History:  Diagnosis Date  . Hypertension     There are no active problems to display for this patient.   Past Surgical History:  Procedure Laterality Date  . HERNIA REPAIR      Prior to Admission medications   Medication Sig Start Date End Date Taking? Authorizing Provider  cephALEXin (KEFLEX) 500 MG capsule Take 1 capsule (500 mg total) by mouth 2 (two) times daily for 7 days. 03/15/19 03/22/19  Chinita Pesterriplett, Elion Hocker B, FNP    Allergies Patient has no known allergies.  No family history on file.  Social History Social History   Tobacco Use  . Smoking status: Current Every Day Smoker  . Smokeless tobacco: Never Used  Substance Use Topics  . Alcohol use: No  . Drug use: Never    Review of Systems Constitutional: Negative for fever. Respiratory: Negative for shortness of breath or cough. Gastrointestinal: Negative for abdominal pain; negative for nausea , positive for vomiting. Genitourinary: Negative for dysuria , negative for vaginal discharge. Musculoskeletal: Negative for back pain. Skin: Negative for acute skin changes/rash/lesion. ____________________________________________   PHYSICAL EXAM:  VITAL SIGNS: ED Triage Vitals  Enc Vitals Group     BP 03/15/19 1252 117/68     Pulse Rate 03/15/19 1252  81     Resp 03/15/19 1252 18     Temp 03/15/19 1252 98.9 F (37.2 C)     Temp Source 03/15/19 1252 Oral     SpO2 03/15/19 1252 98 %     Weight 03/15/19 1252 206 lb (93.4 kg)     Height 03/15/19 1252 5\' 3"  (1.6 m)     Head Circumference --      Peak Flow --      Pain Score 03/15/19 1255 0     Pain Loc --      Pain Edu? --      Excl. in GC? --     Constitutional: Alert and oriented. Well appearing and in no acute distress. Eyes: Conjunctivae are normal. Head: Atraumatic. Nose: No congestion/rhinnorhea. Mouth/Throat: Mucous membranes are moist. Respiratory: Normal respiratory effort.  No retractions. Gastrointestinal: Bowel sounds active x 4; Abdomen is soft without rebound or guarding. Genitourinary: Pelvic exam: not indicated. Musculoskeletal: No extremity tenderness nor edema.  Neurologic:  Normal speech and language. No gross focal neurologic deficits are appreciated. Speech is normal. No gait instability. Skin:  Skin is warm, dry and intact. No rash noted on exposed skin. Psychiatric: Mood and affect are normal. Speech and behavior are normal.  ____________________________________________   LABS (all labs ordered are listed, but only abnormal results are displayed)  Labs Reviewed  URINALYSIS, COMPLETE (UACMP) WITH MICROSCOPIC - Abnormal; Notable for the following components:      Result Value   Color, Urine YELLOW (*)    APPearance CLOUDY (*)    Protein, ur 30 (*)  Nitrite POSITIVE (*)    Bacteria, UA RARE (*)    All other components within normal limits  GLUCOSE, CAPILLARY  POC URINE PREG, ED  CBG MONITORING, ED  POCT PREGNANCY, URINE   ____________________________________________  RADIOLOGY  Not indicated. ____________________________________________  Procedures  ____________________________________________  28 year old female presents with symptoms as listed above. Urinalysis is consistent with acute cystitis. She will be treated with Keflex and  advised to follow up with the PCP of her choice for health concerns. She will return to the ER for symptoms that change or worsen if unable to schedule an appointment.  INITIAL IMPRESSION / ASSESSMENT AND PLAN / ED COURSE  Pertinent labs & imaging results that were available during my care of the patient were reviewed by me and considered in my medical decision making (see chart for details).  ____________________________________________   FINAL CLINICAL IMPRESSION(S) / ED DIAGNOSES  Final diagnoses:  Acute cystitis without hematuria    Note:  This document was prepared using Dragon voice recognition software and may include unintentional dictation errors.   Chinita Pester, FNP 03/19/19 1302    Willy Eddy, MD 03/21/19 [phone]

## 2019-04-04 ENCOUNTER — Emergency Department
Admission: EM | Admit: 2019-04-04 | Discharge: 2019-04-04 | Discharge status: Against Medical Advice | Payer: Medicaid Other | Attending: Emergency Medicine | Admitting: Emergency Medicine

## 2019-04-04 ENCOUNTER — Encounter: Payer: Self-pay | Admitting: Emergency Medicine

## 2019-04-04 ENCOUNTER — Other Ambulatory Visit: Payer: Self-pay

## 2019-04-04 DIAGNOSIS — R531 Weakness: Secondary | ICD-10-CM | POA: Insufficient documentation

## 2019-04-04 DIAGNOSIS — Z5321 Procedure and treatment not carried out due to patient leaving prior to being seen by health care provider: Secondary | ICD-10-CM | POA: Insufficient documentation

## 2019-04-04 DIAGNOSIS — R51 Headache: Secondary | ICD-10-CM | POA: Diagnosis not present

## 2019-04-04 LAB — BASIC METABOLIC PANEL
Anion gap: 8 (ref 5–15)
BUN: 11 mg/dL (ref 6–20)
CO2: 22 mmol/L (ref 22–32)
Calcium: 9.2 mg/dL (ref 8.9–10.3)
Chloride: 107 mmol/L (ref 98–111)
Creatinine, Ser: 0.68 mg/dL (ref 0.44–1.00)
GFR calc Af Amer: >60 mL/min (ref >60)
GFR calc non Af Amer: >60 mL/min (ref >60)
Glucose, Bld: 94 mg/dL (ref 70–99)
Potassium: 4.1 mmol/L (ref 3.5–5.1)
Sodium: 137 mmol/L (ref 135–145)

## 2019-04-04 LAB — CBC
HCT: 36.4 % (ref 36.0–46.0)
Hemoglobin: 11.9 g/dL — ABNORMAL LOW (ref 12.0–15.0)
MCH: 29.5 pg (ref 26.0–34.0)
MCHC: 32.7 g/dL (ref 30.0–36.0)
MCV: 90.1 fL (ref 80.0–100.0)
Platelets: 180 10*3/uL (ref 150–400)
RBC: 4.04 MIL/uL (ref 3.87–5.11)
RDW: 12.6 % (ref 11.5–15.5)
WBC: 5.9 10*3/uL (ref 4.0–10.5)
nRBC: 0 % (ref 0.0–0.2)

## 2019-04-04 MED ORDER — SODIUM CHLORIDE 0.9% FLUSH: 3.0000 mL | Freq: Once | INTRAVENOUS | Status: DC

## 2019-04-04 NOTE — ED Triage Notes (Signed)
C/O weakness, headache x 1 day. STates symptoms started when period started.  AAOx3.  Skin warm and dry. NAD

## 2019-04-30 ENCOUNTER — Ambulatory Visit
Admission: EM | Admit: 2019-04-30 | Discharge: 2019-04-30 | Disposition: A | Discharge status: Home / Self Care | Payer: Medicaid Other | Attending: Family Medicine | Admitting: Family Medicine

## 2019-04-30 ENCOUNTER — Other Ambulatory Visit: Payer: Self-pay

## 2019-04-30 DIAGNOSIS — M545 Low back pain, unspecified: Secondary | ICD-10-CM

## 2019-04-30 MED ORDER — TIZANIDINE HCL 4 MG PO TABS: 4.0000 mg | ORAL_TABLET | Freq: Three times a day (TID) | ORAL | 0 refills | Status: DC | PRN

## 2019-04-30 MED ORDER — KETOROLAC TROMETHAMINE 60 MG/2ML IM SOLN
60.0000 mg | Freq: Once | INTRAMUSCULAR | Status: AC
  Administered 2019-04-30: 14:00:00 60 mg via INTRAMUSCULAR

## 2019-04-30 MED ORDER — KETOROLAC TROMETHAMINE 10 MG PO TABS: 10.0000 mg | ORAL_TABLET | Freq: Four times a day (QID) | ORAL | 0 refills | Status: DC | PRN

## 2019-04-30 NOTE — ED Provider Notes (Signed)
[facility]    CSN: [id number] Arrival date & time: 04/30/19  1245  History   Chief Complaint Chief Complaint  Patient presents with  . Back Pain   HPI  28 year old female presents with low back pain.  Patient reports that her low back pain started abruptly last night.  Has been persistent since that time.  Severe, 10/10.  She describes it as sharp.  Worse with movement.  No relieving factors.  She has taken no medications or tried any interventions for this.  No other reported symptoms.  No other complaints or concerns at this time.  Hx reviewed as below. Past Medical History:  Diagnosis Date  . Hypertension    Past Surgical History:  Procedure Laterality Date  . HERNIA REPAIR     OB History    Gravida  1   Para      Term      Preterm      AB      Living        SAB      TAB      Ectopic      Multiple      Live Births             Home Medications    Prior to Admission medications   Medication Sig Start Date End Date Taking? Authorizing Provider  ketorolac (TORADOL) 10 MG tablet Take 1 tablet (10 mg total) by mouth every 6 (six) hours as needed for moderate pain or severe pain. 04/30/19   Coral Spikes, DO  tiZANidine (ZANAFLEX) 4 MG tablet Take 1 tablet (4 mg total) by mouth every 8 (eight) hours as needed for muscle spasms. 04/30/19   Coral Spikes, DO   Social History Social History   Tobacco Use  . Smoking status: Current Every Day Smoker    Packs/day: 0.50    Types: Cigarettes  . Smokeless tobacco: Never Used  Substance Use Topics  . Alcohol use: No  . Drug use: Never     Allergies   Patient has no known allergies.   Review of Systems Review of Systems  Constitutional: Negative.   Musculoskeletal: Positive for back pain.   Physical Exam Triage Vital Signs ED Triage Vitals  Enc Vitals Group     BP 04/30/19 1310 126/79     Pulse Rate 04/30/19 1310 82     Resp 04/30/19 1310 17     Temp 04/30/19 1310 98.5 F (36.9 C)      Temp Source 04/30/19 1310 Oral     SpO2 --      Weight 04/30/19 1308 205 lb 14.6 oz (93.4 kg)     Height 04/30/19 1308 5\' 3"  (1.6 m)     Head Circumference --      Peak Flow --      Pain Score 04/30/19 1308 10     Pain Loc --      Pain Edu? --      Excl. in [place]? --    Updated Vital Signs BP 126/79 (BP Location: Left Arm)   Pulse 82   Temp 98.5 F (36.9 C) (Oral)   Resp 17   Ht 5\' 3"  (1.6 m)   Wt 93.4 kg   LMP 04/30/2019   BMI 36.48 kg/m   Visual Acuity Right Eye Distance:   Left Eye Distance:   Bilateral Distance:    Right Eye Near:   Left Eye Near:    Bilateral Near:  Physical Exam Vitals signs and nursing note reviewed.  Constitutional:      Comments: Patient is bracing herself on the table.  Appears in pain.  HENT:     Head: Normocephalic and atraumatic.  Eyes:     General:        Right eye: No discharge.        Left eye: No discharge.     Conjunctiva/sclera: Conjunctivae normal.  Pulmonary:     Effort: Pulmonary effort is normal. No respiratory distress.  Musculoskeletal:     Comments: Left low back with exquisite tenderness to palpation.  Muscle spasm noted.  Neurological:     Mental Status: She is alert.  Psychiatric:        Mood and Affect: Mood normal.        Behavior: Behavior normal.    UC Treatments / Results  Labs (all labs ordered are listed, but only abnormal results are displayed) Labs Reviewed - No data to display  EKG   Radiology No results found.  Procedures Procedures (including critical care time)  Medications Ordered in UC Medications  ketorolac (TORADOL) injection 60 mg (60 mg Intramuscular Given 04/30/19 1330)    Initial Impression / Assessment and Plan / UC Course  I have reviewed the triage vital signs and the nursing notes.  Pertinent labs & imaging results that were available during my care of the patient were reviewed by me and considered in my medical decision making (see chart for details).    28 year old  female presents with acute low back pain.  Treating with IM injection of Toradol today.  Sending home on Toradol and Zanaflex.  Final Clinical Impressions(s) / UC Diagnoses   Final diagnoses:  Acute left-sided low back pain without sciatica     Discharge Instructions     Rest. Heat.  Medication as prescribed.  Take care  Dr. Adriana Simasook    ED Prescriptions    Medication Sig Dispense Auth. Provider   ketorolac (TORADOL) 10 MG tablet Take 1 tablet (10 mg total) by mouth every 6 (six) hours as needed for moderate pain or severe pain. 20 tablet Dreshawn Hendershott G, DO   tiZANidine (ZANAFLEX) 4 MG tablet Take 1 tablet (4 mg total) by mouth every 8 (eight) hours as needed for muscle spasms. 30 tablet Tommie Samsook, Keishia Ground G, DO     Controlled Substance Prescriptions [place] Controlled Substance Registry consulted? Not Applicable   Tommie SamsCook, Dynastie Knoop G, DO 04/30/19 1410

## 2019-04-30 NOTE — ED Triage Notes (Signed)
Patient complains of low back pain that started last night and she states that has been constant. Patient states that pain feels like a stabbing.

## 2019-04-30 NOTE — Discharge Instructions (Signed)
Rest.  Heat.  Medication as prescribed.  Take care  Dr. Sherryann Frese  

## 2019-07-10 LAB — OB RESULTS CONSOLE VARICELLA ZOSTER ANTIBODY, IGG: Varicella: IMMUNE

## 2019-07-25 ENCOUNTER — Other Ambulatory Visit: Payer: Self-pay

## 2019-07-25 ENCOUNTER — Ambulatory Visit (INDEPENDENT_AMBULATORY_CARE_PROVIDER_SITE_OTHER): Payer: Medicaid Other | Admitting: Advanced Practice Midwife

## 2019-07-25 ENCOUNTER — Encounter: Payer: Self-pay | Admitting: Advanced Practice Midwife

## 2019-07-25 VITALS — BP 124/78 | Wt 245.0 lb

## 2019-07-25 DIAGNOSIS — E221 Hyperprolactinemia: Secondary | ICD-10-CM

## 2019-07-25 DIAGNOSIS — E0590 Thyrotoxicosis, unspecified without thyrotoxic crisis or storm: Secondary | ICD-10-CM

## 2019-07-25 DIAGNOSIS — O0990 Supervision of high risk pregnancy, unspecified, unspecified trimester: Secondary | ICD-10-CM

## 2019-07-25 DIAGNOSIS — Z6841 Body Mass Index (BMI) 40.0 and over, adult: Secondary | ICD-10-CM

## 2019-07-25 DIAGNOSIS — O99211 Obesity complicating pregnancy, first trimester: Secondary | ICD-10-CM

## 2019-07-25 DIAGNOSIS — Z113 Encounter for screening for infections with a predominantly sexual mode of transmission: Secondary | ICD-10-CM

## 2019-07-25 DIAGNOSIS — O0991 Supervision of high risk pregnancy, unspecified, first trimester: Secondary | ICD-10-CM | POA: Diagnosis not present

## 2019-07-25 DIAGNOSIS — Z3A08 8 weeks gestation of pregnancy: Secondary | ICD-10-CM

## 2019-07-25 DIAGNOSIS — O09291 Supervision of pregnancy with other poor reproductive or obstetric history, first trimester: Secondary | ICD-10-CM | POA: Diagnosis not present

## 2019-07-25 DIAGNOSIS — O09299 Supervision of pregnancy with other poor reproductive or obstetric history, unspecified trimester: Secondary | ICD-10-CM

## 2019-07-25 DIAGNOSIS — O99210 Obesity complicating pregnancy, unspecified trimester: Secondary | ICD-10-CM

## 2019-07-25 NOTE — Patient Instructions (Signed)
Exercise During Pregnancy Exercise is an important part of being healthy for people of all ages. Exercise improves the function of your heart and lungs and helps you maintain strength, flexibility, and a healthy body weight. Exercise also boosts energy levels and elevates mood. Most women should exercise regularly during pregnancy. In rare cases, women with certain medical conditions or complications may be asked to limit or avoid exercise during pregnancy. How does this affect me? Along with maintaining general strength and flexibility, exercising during pregnancy can help:  Keep strength in muscles that are used during labor and childbirth.  Decrease low back pain.  Reduce symptoms of depression.  Control weight gain during pregnancy.  Reduce the risk of needing insulin if you develop diabetes during pregnancy.  Decrease the risk of cesarean delivery.  Speed up your recovery after giving birth. How does this affect my baby? Exercise can help you have a healthy pregnancy. Exercise does not cause premature birth. It will not cause your baby to weigh less at birth. What exercises can I do? Many exercises are safe for you to do during pregnancy. Do a variety of exercises that safely increase your heart and breathing rates and help you build and maintain muscle strength. Do exercises exactly as told by your health care provider. You may do these exercises:  Walking or hiking.  Swimming.  Water aerobics.  Riding a stationary bike.  Strength training.  Modified yoga or Pilates. Tell your instructor that you are pregnant. Avoid overstretching, and avoid lying on your back for long periods of time.  Running or jogging. Only choose this type of exercise if you: ? Ran or jogged regularly before your pregnancy. ? Can run or jog and still talk in complete sentences. What exercises should I avoid? Depending on your level of fitness and whether you exercised regularly before your  pregnancy, you may be told to limit high-intensity exercise. You can tell that you are exercising at a high intensity if you are breathing much harder and faster and cannot hold a conversation while exercising. You must avoid:  Contact sports.  Activities that put you at risk for falling on or being hit in the belly, such as downhill skiing, water skiing, surfing, rock climbing, cycling, gymnastics, and horseback riding.  Scuba diving.  Skydiving.  Yoga or Pilates in a room that is heated to high temperatures.  Jogging or running, unless you ran or jogged regularly before your pregnancy. While jogging or running, you should always be able to talk in full sentences. Do not run or jog so fast that you are unable to have a conversation.  Do not exercise at more than 6,000 feet above sea level (high elevation) if you are not used to exercising at high elevation. How do I exercise in a safe way?   Avoid overheating. Do not exercise in very high temperatures.  Wear loose-fitting, breathable clothes.  Avoid dehydration. Drink enough water before, during, and after exercise to keep your urine pale yellow.  Avoid overstretching. Because of hormone changes during pregnancy, it is easy to overstretch muscles, tendons, and ligaments during pregnancy.  Start slowly and ask your health care provider to recommend the types of exercise that are safe for you.  Do not exercise to lose weight. Follow these instructions at home:  Exercise on most days or all days of the week. Try to exercise for 30 minutes a day, 5 days a week, unless your health care provider tells you not to.  If   you actively exercised before your pregnancy and you are healthy, your health care provider may tell you to continue to do moderate to high-intensity exercise.  If you are just starting to exercise or did not exercise much before your pregnancy, your health care provider may tell you to do low to moderate-intensity  exercise. Questions to ask your health care provider  Is exercise safe for me?  What are signs that I should stop exercising?  Does my health condition mean that I should not exercise during pregnancy?  When should I avoid exercising during pregnancy? Stop exercising and contact a health care provider if: You have any unusual symptoms, such as:  Mild contractions of the uterus or cramps in the abdomen.  Dizziness that does not go away when you rest. Stop exercising and get help right away if: You have any unusual symptoms, such as:  Sudden, severe pain in your low back or your belly.  Mild contractions of the uterus or cramps in the abdomen that do not improve with rest and drinking fluids.  Chest pain.  Bleeding or fluid leaking from your vagina.  Shortness of breath. These symptoms may represent a serious problem that is an emergency. Do not wait to see if the symptoms will go away. Get medical help right away. Call your local emergency services (911 in the [place] Do not drive yourself to the hospital. Summary  Most women should exercise regularly throughout pregnancy. In rare cases, women with certain medical conditions or complications may be asked to limit or avoid exercise during pregnancy.  Do not exercise to lose weight during pregnancy.  Your health care provider will tell you what level of physical activity is right for you.  Stop exercising and contact a health care provider if you have mild contractions of the uterus or cramps in the abdomen. Get help right away if these contractions or cramps do not improve with rest and drinking fluids.  Stop exercising and get help right away if you have sudden, severe pain in your low back or belly, chest pain, shortness of breath, or bleeding or leaking of fluid from your vagina. This information is not intended to replace advice given to you by your health care provider. Make sure you discuss any questions you have with your  health care provider. Document Released: 10/11/2005 Document Revised: 02/01/2019 Document Reviewed: 11/15/2018 Elsevier Patient Education  2020 [employer] Eating Plan for Pregnant Women While you are pregnant, your body requires additional nutrition to help support your growing baby. You also have a higher need for some vitamins and minerals, such as folic acid, calcium, iron, and vitamin D. Eating a healthy, well-balanced diet is very important for your health and your baby's health. Your need for extra calories varies for the three 3-month segments of your pregnancy (trimesters). For most women, it is recommended to consume:  150 extra calories a day during the first trimester.  300 extra calories a day during the second trimester.  300 extra calories a day during the third trimester. What are tips for following this plan?   Do not try to lose weight or go on a diet during pregnancy.  Limit your overall intake of foods that have "empty calories." These are foods that have little nutritional value, such as sweets, desserts, candies, and sugar-sweetened beverages.  Eat a variety of foods (especially fruits and vegetables) to get a full range of vitamins and minerals.  Take a prenatal vitamin to help meet your additional vitamin   and mineral needs during pregnancy, specifically for folic acid, iron, calcium, and vitamin D.  Remember to stay active. Ask your health care provider what types of exercise and activities are safe for you.  Practice good food safety and cleanliness. Wash your hands before you eat and after you prepare raw meat. Wash all fruits and vegetables well before peeling or eating. Taking these actions can help to prevent food-borne illnesses that can be very dangerous to your baby, such as listeriosis. Ask your health care provider for more information about listeriosis. What does 150 extra calories look like? Healthy options that provide 150 extra calories each day  could be any of the following:  6-8 oz (170-230 g) of plain low-fat yogurt with  cup of berries.  1 apple with 2 teaspoons (11 g) of peanut butter.  Cut-up vegetables with  cup (60 g) of hummus.  8 oz (230 mL) or 1 cup of low-fat chocolate milk.  1 stick of string cheese with 1 medium orange.  1 peanut butter and jelly sandwich that is made with one slice of whole-wheat bread and 1 tsp (5 g) of peanut butter. For 300 extra calories, you could eat two of those healthy options each day. What is a healthy amount of weight to gain? The right amount of weight gain for you is based on your BMI before you became pregnant. If your BMI:  Was less than 18 (underweight), you should gain 28-40 lb (13-18 kg).  Was 18-24.9 (normal), you should gain 25-35 lb (11-16 kg).  Was 25-29.9 (overweight), you should gain 15-25 lb (7-11 kg).  Was 30 or greater (obese), you should gain 11-20 lb (5-9 kg). What if I am having twins or multiples? Generally, if you are carrying twins or multiples:  You may need to eat 300-600 extra calories a day.  The recommended range for total weight gain is 25-54 lb (11-25 kg), depending on your BMI before pregnancy.  Talk with your health care provider to find out about nutritional needs, weight gain, and exercise that is right for you. What foods can I eat?  Grains All grains. Choose whole grains, such as whole-wheat bread, oatmeal, or brown rice. Vegetables All vegetables. Eat a variety of colors and types of vegetables. Remember to wash your vegetables well before peeling or eating. Fruits All fruits. Eat a variety of colors and types of fruit. Remember to wash your fruits well before peeling or eating. Meats and other protein foods Lean meats, including chicken, turkey, fish, and lean cuts of beef, veal, or pork. If you eat fish or seafood, choose options that are higher in omega-3 fatty acids and lower in mercury, such as salmon, herring, mussels, trout,  sardines, pollock, shrimp, crab, and lobster. Tofu. Tempeh. Beans. Eggs. Peanut butter and other nut butters. Make sure that all meats, poultry, and eggs are cooked to food-safe temperatures or "well-done." Two or more servings of fish are recommended each week in order to get the most benefits from omega-3 fatty acids that are found in seafood. Choose fish that are lower in mercury. You can find more information online:  www.fda.gov Dairy Pasteurized milk and milk alternatives (such as almond milk). Pasteurized yogurt and pasteurized cheese. Cottage cheese. Sour cream. Beverages Water. Juices that contain 100% fruit juice or vegetable juice. Caffeine-free teas and decaffeinated coffee. Drinks that contain caffeine are okay to drink, but it is better to avoid caffeine. Keep your total caffeine intake to less than 200 mg each day (which   is 12 oz or 355 mL of coffee, tea, or soda) or the limit as told by your health care provider. Fats and oils Fats and oils are okay to include in moderation. Sweets and desserts Sweets and desserts are okay to include in moderation. Seasoning and other foods All pasteurized condiments. The items listed above may not be a complete list of recommended foods and beverages. Contact your dietitian for more options. The items listed above may not be a complete list of foods and beverages [you/your child] can eat. Contact a dietitian for more information. What foods are not recommended? Vegetables Raw (unpasteurized) vegetable juices. Fruits Unpasteurized fruit juices. Meats and other protein foods Lunch meats, bologna, hot dogs, or other deli meats. (If you must eat those meats, reheat them until they are steaming hot.) Refrigerated pat, meat spreads from a meat counter, smoked seafood that is found in the refrigerated section of a store. Raw or undercooked meats, poultry, and eggs. Raw fish, such as sushi or sashimi. Fish that have high mercury content, such as  tilefish, shark, swordfish, and king mackerel. To learn more about mercury in fish, talk with your health care provider or look for online resources, such as:  www.fda.gov Dairy Raw (unpasteurized) milk and any foods that have raw milk in them. Soft cheeses, such as feta, queso blanco, queso fresco, Brie, Camembert cheeses, blue-veined cheeses, and Panela cheese (unless it is made with pasteurized milk, which must be stated on the label). Beverages Alcohol. Sugar-sweetened beverages, such as sodas, teas, or energy drinks. Seasoning and other foods Homemade fermented foods and drinks, such as pickles, sauerkraut, or kombucha drinks. (Store-bought pasteurized versions of these are okay.) Salads that are made in a store or deli, such as ham salad, chicken salad, egg salad, tuna salad, and seafood salad. The items listed above may not be a complete list of foods and beverages to avoid. Contact your dietitian for more information. The items listed above may not be a complete list of foods and beverages [you/your child] should avoid. Contact a dietitian for more information. Where to find more information To calculate the number of calories you need based on your height, weight, and activity level, you can use an online calculator such as:  www.choosemyplate.gov/MyPlatePlan To calculate how much weight you should gain during pregnancy, you can use an online pregnancy weight gain calculator such as:  www.choosemyplate.gov/pregnancy-weight-gain-calculator Summary  While you are pregnant, your body requires additional nutrition to help support your growing baby.  Eat a variety of foods, especially fruits and vegetables to get a full range of vitamins and minerals.  Practice good food safety and cleanliness. Wash your hands before you eat and after you prepare raw meat. Wash all fruits and vegetables well before peeling or eating. Taking these actions can help to prevent food-borne illnesses, such as  listeriosis, that can be very dangerous to your baby.  Do not eat raw meat or fish. Do not eat fish that have high mercury content, such as tilefish, shark, swordfish, and king mackerel. Do not eat unpasteurized (raw) dairy.  Take a prenatal vitamin to help meet your additional vitamin and mineral needs during pregnancy, specifically for folic acid, iron, calcium, and vitamin D. This information is not intended to replace advice given to you by your health care provider. Make sure you discuss any questions you have with your health care provider. Document Released: 07/26/2014 Document Revised: 02/01/2019 Document Reviewed: 07/08/2017 Elsevier Patient Education  2020 [employer] Inc. Prenatal Care Prenatal care   is health care during pregnancy. It helps you and your unborn baby (fetus) stay as healthy as possible. Prenatal care may be provided by a midwife, a family practice health care provider, or a childbirth and pregnancy specialist (obstetrician). How does this affect me? During pregnancy, you will be closely monitored for any new conditions that might develop. To lower your risk of pregnancy complications, you and your health care provider will talk about any underlying conditions you have. How does this affect my baby? Early and consistent prenatal care increases the chance that your baby will be healthy during pregnancy. Prenatal care lowers the risk that your baby will be:  Born early (prematurely).  Smaller than expected at birth (small for gestational age). What can I expect at the first prenatal care visit? Your first prenatal care visit will likely be the longest. You should schedule your first prenatal care visit as soon as you know that you are pregnant. Your first visit is a good time to talk about any questions or concerns you have about pregnancy. At your visit, you and your health care provider will talk about:  Your medical history, including: ? Any past pregnancies. ? Your  family's medical history. ? The baby's father's medical history. ? Any long-term (chronic) health conditions you have and how you manage them. ? Any surgeries or procedures you have had. ? Any current over-the-counter or prescription medicines, herbs, or supplements you are taking.  Other factors that could pose a risk to your baby, including:  Your home setting and your stress levels, including: ? Exposure to abuse or violence. ? Household financial strain. ? Mental health conditions you have.  Your daily health habits, including diet and exercise. Your health care provider will also:  Measure your weight, height, and blood pressure.  Do a physical exam, including a pelvic and breast exam.  Perform blood tests and urine tests to check for: ? Urinary tract infection. ? Sexually transmitted infections (STIs). ? Low iron levels in your blood (anemia). ? Blood type and certain proteins on red blood cells (Rh antibodies). ? Infections and immunity to viruses, such as hepatitis B and rubella. ? HIV (human immunodeficiency virus).  Do an ultrasound to confirm your baby's growth and development and to help predict your estimated due date (EDD). This ultrasound is done with a probe that is inserted into the vagina (transvaginal ultrasound).  Discuss your options for genetic screening.  Give you information about how to keep yourself and your baby healthy, including: ? Nutrition and taking vitamins. ? Physical activity. ? How to manage pregnancy symptoms such as nausea and vomiting (morning sickness). ? Infections and substances that may be harmful to your baby and how to avoid them. ? Food safety. ? Dental care. ? Working. ? Travel. ? Warning signs to watch for and when to call your health care provider. How often will I have prenatal care visits? After your first prenatal care visit, you will have regular visits throughout your pregnancy. The visit schedule is often as follows:   Up to week 28 of pregnancy: once every 4 weeks.  28-36 weeks: once every 2 weeks.  After 36 weeks: every week until delivery. Some women may have visits more or less often depending on any underlying health conditions and the health of the baby. Keep all follow-up and prenatal care visits as told by your health care provider. This is important. What happens during routine prenatal care visits? Your health care provider will:  Measure your   weight and blood pressure.  Check for fetal heart sounds.  Measure the height of your uterus in your abdomen (fundal height). This may be measured starting around week 20 of pregnancy.  Check the position of your baby inside your uterus.  Ask questions about your diet, sleeping patterns, and whether you can feel the baby move.  Review warning signs to watch for and signs of labor.  Ask about any pregnancy symptoms you are having and how you are dealing with them. Symptoms may include: ? Headaches. ? Nausea and vomiting. ? Vaginal discharge. ? Swelling. ? Fatigue. ? Constipation. ? Any discomfort, including back or pelvic pain. Make a list of questions to ask your health care provider at your routine visits. What tests might I have during prenatal care visits? You may have blood, urine, and imaging tests throughout your pregnancy, such as:  Urine tests to check for glucose, protein, or signs of infection.  Glucose tests to check for a form of diabetes that can develop during pregnancy (gestational diabetes mellitus). This is usually done around week 24 of pregnancy.  An ultrasound to check your baby's growth and development and to check for birth defects. This is usually done around week 20 of pregnancy.  A test to check for group B strep (GBS) infection. This is usually done around week 36 of pregnancy.  Genetic testing. This may include blood or imaging tests, such as an ultrasound. Some genetic tests are done during the first trimester  and some are done during the second trimester. What else can I expect during prenatal care visits? Your health care provider may recommend getting certain vaccines during pregnancy. These may include:  A yearly flu shot (annual influenza vaccine). This is especially important if you will be pregnant during flu season.  Tdap (tetanus, diphtheria, pertussis) vaccine. Getting this vaccine during pregnancy can protect your baby from whooping cough (pertussis) after birth. This vaccine may be recommended between weeks 27 and 36 of pregnancy. Later in your pregnancy, your health care provider may give you information about:  Childbirth and breastfeeding classes.  Choosing a health care provider for your baby.  Umbilical cord banking.  Breastfeeding.  Birth control after your baby is born.  The hospital labor and delivery unit and how to tour it.  Registering at the hospital before you go into labor. Where to find more information  Office on Women's Health: womenshealth.gov  American Pregnancy Association: americanpregnancy.org  March of Dimes: marchofdimes.org Summary  Prenatal care helps you and your baby stay as healthy as possible during pregnancy.  Your first prenatal care visit will most likely be the longest.  You will have visits and tests throughout your pregnancy to monitor your health and your baby's health.  Bring a list of questions to your visits to ask your health care provider.  Make sure to keep all follow-up and prenatal care visits with your health care provider. This information is not intended to replace advice given to you by your health care provider. Make sure you discuss any questions you have with your health care provider. Document Released: 10/14/2003 Document Revised: 01/31/2019 Document Reviewed: 10/10/2017 Elsevier Patient Education  2020 Elsevier Inc.    COVID-19 and Your Pregnancy FAQ  How can I prevent infection with COVID-19 during my  pregnancy? Social distancing is key. Please limit any interactions in public. Try and work from home if possible. Frequently wash your hands after touching possibly contaminated surfaces. Avoid touching your face.  Minimize trips to   the store. Consider online ordering when possible.   Should I wear a mask? YES. It is recommended by the CDC that all people wear a cloth mask or facial covering in public. You should wear a mask to your visits in the office. This will help reduce transmission as well as your risk or acquiring COVID-19. New studies are showing that even asymptomatic individuals can spread the virus from talking.   Where can I get a mask? [place] and the city of [place] are partnering to provide masks to community members. You can pick up a mask from several locations. This website also has instructions about how to make a mask by sewing or without sewing by using a t-shirt or bandana.  https://www.[place].gov/i-want-to/learn-about/covid-19-information-and-updates/covid-19-face-mask-project  Studies have shown that if you were a tube or nylon stocking from pantyhose over a cloth mask it makes the cloth mask almost as effective as a N95 mask.  https://www.npr.org/sections/goatsandsoda/2019/02/14/840146830/adding-a-nylon-stocking-layer-could-boost-protection-from-cloth-masks-study-find  What are the symptoms of COVID-19? Fever (greater than 100.4 F), dry cough, shortness of breath.  Am I more at risk for COVID-19 since I am pregnant? There is not currently data showing that pregnant women are more adversely impacted by COVID-19 than the general population. However, we know that pregnant women tend to have worse respiratory complications from similar diseases such as the flu and SARS and for this reason should be considered an at-risk population.  What do I do if I am experiencing the symptoms of COVID-19? Testing is being limited because of test availability. If you are  experiencing symptoms you should quarantine yourself, and the members of your family, for at least 2 weeks at home.   Please visit this website for more information: https://www.cdc.gov/coronavirus/2019-ncov/if-you-are-sick/steps-when-sick.html  When should I go to the Emergency Room? Please go to the emergency room if you are experiencing ANY of these symptoms*:  1.    Difficulty breathing or shortness of breath 2.    Persistent pain or pressure in the chest 3.    Confusion or difficulty being aroused (or awakened) 4.    Bluish lips or face  *This list is not all inclusive. Please consult our office for any other symptoms that are severe or concerning.  What do I do if I am having difficulty breathing? You should go to the Emergency Room for evaluation. At this time they have a tent set up for evaluating patients with COVID-19 symptoms.   How will my prenatal care be different because of the COVID-19 pandemic? It has been recommended to reduce the frequency of face-to-face visits and use resources such as telephone and virtual visits when possible. Using a scale, blood pressure machine and fetal doppler at home can further help reduce face-to-face visits. You will be provided with additional information on this topic.  We ask that you come to your visits alone to minimize potential exposures to  COVID-19.  How can I receive childbirth education? At this time in-person classes have been cancelled. You can register for online childbirth education, breastfeeding, and newborn care classes.  Please visit:  [email or web address] for more information  How will my hospital birth experience be different? The hospital is currently limiting visitors. This means that while you are in labor you can only have one person at the hospital with you. Additional family members will not be allowed to wait in the building or outside your room. Your one support person can be the father of the baby, a  relative, a doula, or a friend. Once one support person   is designated that person will wear a band. This band cannot be shared with multiple people.  Nitrous Gas is not being offered for pain relief since the tubing and filter for the machine can not be sanitized in a way to guarantee prevention of transmission of COVID-19.  Nasal cannula use of oxygen for fetal indications has also been discontinued.  Currently a clear plastic sheet is being hung between mom and the delivering provider during pushing and delivery to help prevent transmission of COVID-19.      How long will I stay in the hospital for after giving birth? It is also recommended that discharge home be expedited during the COVID-19 outbreak. This means staying for 1 day after a vaginal delivery and 2 days after a cesarean section. Patients who need to stay longer for medical reasons are allowed to do so, but the goal will be for expedited discharge home.   What if I have COVID-19 and I am in labor? We ask that you wear a mask while on labor and delivery. We will try and accommodate you being placed in a room that is capable of filtering the air. Please call ahead if you are in labor and on your way to the hospital. The phone number for labor and delivery at [facility] is [phone].  If I have COVID-19 when my baby is born how can I prevent my baby from contracting COVID-19? This is an issue that will have to be discussed on a case-by-case basis. Current recommendations suggest providing separate isolation rooms for both the mother and new infant as well as limiting visitors. However, there are practical challenges to this recommendation. The situation will assuredly change and decisions will be influenced by the desires of the mother and availability of space.  Some suggestions are the use of a curtain or physical barrier between mom and infant, hand hygiene, mom wearing a mask, or 6 feet of spacing between a  mom and infant.   Can I breastfeed during the COVID-19 pandemic?   Yes, breastfeeding is encouraged.  Can I breastfeed if I have COVID-19? Yes. Covid-19 has not been found in breast milk. This means you cannot give COVID-19 to your child through breast milk. Breast feeding will also help pass antibodies to fight infection to your baby.   What precautions should I take when breastfeeding if I have COVID-19? If a mother and newborn do room-in and the mother wishes to feed at the breast, she should put on a facemask and practice hand hygiene before each feeding.  What precautions should I take when pumping if I have COVID-19? Prior to expressing breast milk, mothers should practice hand hygiene. After each pumping session, all parts that come into contact with breast milk should be thoroughly washed and the entire pump should be appropriately disinfected per the manufacturer's instructions. This expressed breast milk should be fed to the newborn by a healthy caregiver.  What if I am pregnant and work in healthcare? Based on limited data regarding COVID-19 and pregnancy, ACOG currently does not propose creating additional restrictions on pregnant health care personnel because of COVID-19 alone. Pregnant women do not appear to be at higher risk of severe disease related to COVID-19. Pregnant health care personnel should follow CDC risk assessment and infection control guidelines for health care personnel exposed to patients with suspected or confirmed COVID-19. Adherence to recommended infection prevention and control practices is an important part of protecting all health care personnel in   health care settings.    Information on COVID-19 in pregnancy is very limited; however, facilities may want to consider limiting exposure of pregnant health care personnel to patients with confirmed or suspected COVID-19 infection, especially during higher-risk procedures (eg, aerosol-generating procedures), if  feasible, based on staffing availability.    

## 2019-07-25 NOTE — Progress Notes (Signed)
NOB today. No vb. No lof. LMP: 05/26/2019. Pt had hypertension with previous pregnancy in 2019

## 2019-07-26 NOTE — Progress Notes (Signed)
New Obstetric Patient H&P    Chief Complaint: "Desires prenatal care"   History of Present Illness: Patient is a 28 y.o. G2P1001 Not Hispanic or Latino female, presents with amenorrhea and positive home pregnancy test. Patient's last menstrual period was 05/26/2019 exact date. and based on her  LMP, her EDD is Estimated Date of Delivery: 03/01/20 and her EGA is 8864w5d. Cycles are 5. days, regular, and occur approximately every : 28 days. Her last pap smear was 2 years ago and was no abnormalities.    She had a urine pregnancy test which was positive 3 week(s)  ago. Her last menstrual period was lighter and lasted for  3 day(s). Since her LMP she claims she has experienced breast tenderness, fatigue, nausea, vomiting, constipation, headaches. She denies vaginal bleeding. Her past medical history is contributory for subclinical hyperthyroid. Her prior pregnancies are notable for postpartum preeclampsia [facility] Baby was 6 pounds 2 ounces.  Since her LMP, she admits to the use of tobacco products  Yes she smokes 2 cigarettes per day and is trying to quit She claims she has gained   30 pounds since the start of her pregnancy. Actual weight gain since July is 40 pounds.  There are cats in the home in the home  no  She admits close contact with children on a regular basis  yes  She has had chicken pox in the past no She has had Tuberculosis exposures, symptoms, or previously tested positive for TB   no Current or past history of domestic violence. no  Genetic Screening/Teratology Counseling: (Includes patient, baby's father, or anyone in either family with:)   1. [facility]  no 2. Thalassemia ([place], [place], Mediterranean, or Asian background): MCV<80  no 3. Neural tube defect (meningomyelocele, spina bifida, anencephaly)  no 4. Congenital heart defect  no  5. Down syndrome  no 6. Tay-Sachs (Jewish, [place] Canadian)  no 7. Canavan's  Disease  no 8. Sickle cell disease or trait (African)  no  9. Hemophilia or other blood disorders  no  10. Muscular dystrophy  no  11. Cystic fibrosis  no  12. Huntington's Chorea  no  13. Mental retardation/autism  no 14. Other inherited genetic or chromosomal disorder  no 15. Maternal metabolic disorder (DM, PKU, etc)  no 16. Patient or FOB with a child with a birth defect not listed above no  16a. Patient or FOB with a birth defect themselves no 17. Recurrent pregnancy loss, or stillbirth  no  18. Any medications since LMP other than prenatal vitamins (include vitamins, supplements, OTC meds, drugs, alcohol)  no 19. Any other genetic/environmental exposure to discuss  no  Infection History:   1. Lives with someone with TB or TB exposed  no  2. Patient or partner has history of genital herpes  no 3. Rash or viral illness since LMP  no 4. History of STI (GC, CT, HPV, syphilis, HIV)  no 5. History of recent travel :  no  Other pertinent information:  She claims weight gain is due to stress involving her first child. She did not elaborate.    Review of Systems:10 point review of systems negative unless otherwise noted in HPI  Past Medical History:  Past Medical History:  Diagnosis Date  . Hypertension   . Hypertension affecting pregnancy     Past Surgical History:  Past Surgical History:  Procedure Laterality Date  . HERNIA REPAIR  Gynecologic History: Patient's last menstrual period was 05/26/2019.  Obstetric History: G2P1001  Family History:  History reviewed. No pertinent family history.  Social History:  Social History   Socioeconomic History  . Marital status: Married    Spouse name: Not on file  . Number of children: Not on file  . Years of education: Not on file  . Highest education level: Not on file  Occupational History  . Not on file  Social Needs  . Financial resource strain: Not on file  . Food insecurity    Worry: Not on file     Inability: Not on file  . Transportation needs    Medical: Not on file    Non-medical: Not on file  Tobacco Use  . Smoking status: Current Every Day Smoker    Packs/day: 0.50    Types: Cigarettes  . Smokeless tobacco: Never Used  Substance and Sexual Activity  . Alcohol use: No  . Drug use: Never  . Sexual activity: Yes    Birth control/protection: None  Lifestyle  . Physical activity    Days per week: Not on file    Minutes per session: Not on file  . Stress: Not on file  Relationships  . Social Herbalist on phone: Not on file    Gets together: Not on file    Attends religious service: Not on file    Active member of club or organization: Not on file    Attends meetings of clubs or organizations: Not on file    Relationship status: Not on file  . Intimate partner violence    Fear of current or ex partner: Not on file    Emotionally abused: Not on file    Physically abused: Not on file    Forced sexual activity: Not on file  Other Topics Concern  . Not on file  Social History Narrative  . Not on file    Allergies:  No Known Allergies  Medications: Prior to Admission medications   Not on File    Physical Exam Vitals: Blood pressure 124/78, weight 245 lb (111.1 kg), last menstrual period 05/26/2019.  General: NAD HEENT: normocephalic, anicteric Thyroid: no enlargement, no palpable nodules Pulmonary: No increased work of breathing, CTAB Cardiovascular: RRR, distal pulses 2+ Abdomen: NABS, soft, non-tender, non-distended.  Umbilicus without lesions.  No hepatomegaly, splenomegaly or masses palpable. No evidence of hernia  Genitourinary: deferred for no concerns/PAP interval Extremities: no edema, erythema, or tenderness Neurologic: Grossly intact Psychiatric: mood appropriate, affect full   Assessment: 28 y.o. G2P1001 at [redacted]w[redacted]d by LMP presenting to initiate prenatal care  Plan: 1) Avoid alcoholic beverages. 2) Patient encouraged not to smoke.  3)  Discontinue the use of all non-medicinal drugs and chemicals.  4) Take prenatal vitamins daily.  5) Nutrition, food safety (fish, cheese advisories, and high nitrite foods) and exercise discussed. 6) Hospital and practice style discussed with cross coverage system.  7) Genetic Screening, such as with 1st Trimester Screening, cell free fetal DNA, AFP testing, and Ultrasound, as well as with amniocentesis and CVS as appropriate, is discussed with patient. At the conclusion of today's visit patient declined genetic testing 8) Patient is asked about travel to areas at risk for the [place] virus, and counseled to avoid travel and exposure to mosquitoes or sexual partners who may have themselves been exposed to the virus. Testing is discussed, and will be ordered as appropriate.  9) Unable to leave urine specimen today. All labs are  future ordered. 10) Return to clinic in 1 week for early 1 hr gtt, dating and all other labs.   Tresea Mall, CNM [facility] 07/26/2019, 10:07 AM

## 2019-08-03 ENCOUNTER — Ambulatory Visit (INDEPENDENT_AMBULATORY_CARE_PROVIDER_SITE_OTHER): Payer: Medicaid Other | Admitting: Obstetrics and Gynecology

## 2019-08-03 ENCOUNTER — Other Ambulatory Visit: Payer: Self-pay

## 2019-08-03 ENCOUNTER — Ambulatory Visit (INDEPENDENT_AMBULATORY_CARE_PROVIDER_SITE_OTHER): Payer: Medicaid Other

## 2019-08-03 ENCOUNTER — Other Ambulatory Visit: Payer: Medicaid Other

## 2019-08-03 DIAGNOSIS — Z3A09 9 weeks gestation of pregnancy: Secondary | ICD-10-CM | POA: Diagnosis not present

## 2019-08-03 DIAGNOSIS — O09299 Supervision of pregnancy with other poor reproductive or obstetric history, unspecified trimester: Secondary | ICD-10-CM

## 2019-08-03 DIAGNOSIS — E0590 Thyrotoxicosis, unspecified without thyrotoxic crisis or storm: Secondary | ICD-10-CM

## 2019-08-03 DIAGNOSIS — O3481 Maternal care for other abnormalities of pelvic organs, first trimester: Secondary | ICD-10-CM | POA: Diagnosis not present

## 2019-08-03 DIAGNOSIS — O0990 Supervision of high risk pregnancy, unspecified, unspecified trimester: Secondary | ICD-10-CM

## 2019-08-03 DIAGNOSIS — O99210 Obesity complicating pregnancy, unspecified trimester: Secondary | ICD-10-CM

## 2019-08-03 DIAGNOSIS — E221 Hyperprolactinemia: Secondary | ICD-10-CM

## 2019-08-03 DIAGNOSIS — N8311 Corpus luteum cyst of right ovary: Secondary | ICD-10-CM

## 2019-08-03 DIAGNOSIS — Z113 Encounter for screening for infections with a predominantly sexual mode of transmission: Secondary | ICD-10-CM

## 2019-08-03 DIAGNOSIS — Z6841 Body Mass Index (BMI) 40.0 and over, adult: Secondary | ICD-10-CM

## 2019-08-03 DIAGNOSIS — O0991 Supervision of high risk pregnancy, unspecified, first trimester: Secondary | ICD-10-CM

## 2019-08-03 NOTE — Progress Notes (Signed)
ROB Dating scan Early GTT

## 2019-08-03 NOTE — Progress Notes (Signed)
° ° °  Routine Prenatal Care Visit  Subjective  Janice Mccall is a 28 y.o. G2P1001 at [redacted]w[redacted]d being seen today for ongoing prenatal care.  She is currently monitored for the following issues for this high-risk pregnancy and has Hyperprolactinemia ([place]); Hypopituitarism ([place]); Subclinical hyperthyroidism; Supervision of high risk pregnancy, antepartum; Obesity affecting pregnancy, antepartum; and BMI 40.0-44.9, adult ([place]) on their problem list.  ----------------------------------------------------------------------------------- Patient reports no complaints.    .  .   . Denies leaking of fluid.  ----------------------------------------------------------------------------------- The following portions of the patient's history were reviewed and updated as appropriate: allergies, current medications, past family history, past medical history, past social history, past surgical history and problem list. Problem list updated.   Objective  Blood pressure 124/68, weight 244 lb (110.7 kg), last menstrual period 05/26/2019. Pregravid weight Pregravid weight not on file Total Weight Gain Not found. Urinalysis:      Fetal Status:           General:  Alert, oriented and cooperative. Patient is in no acute distress.  Skin: Skin is warm and dry. No rash noted.   Cardiovascular: Normal heart rate noted  Respiratory: Normal respiratory effort, no problems with respiration noted  Abdomen: Soft, gravid, appropriate for gestational age.       Pelvic:  Cervical exam deferred        Extremities: Normal range of motion.     ental Status: Normal mood and affect. Normal behavior. Normal judgment and thought content.   US Ob Comp Less 14 Wks  Result Date: 08/03/2019 Patient Name: Janice Mccall DOB: June 02, 1991 MRN: [id number] ULTRASOUND REPORT Location: [facility] Date of Service: 08/03/2019 Indications:dating Findings: Janice Mccall intrauterine pregnancy is visualized with a CRL consistent with [redacted]w[redacted]d  gestation, giving an (U/S) EDD of 03/02/2020. The (U/S) EDD is consistent with the clinically established EDD of 03/01/2020. FHR: 173 BPM CRL measurement: 28.8 mm Yolk sac is visualized and appears normal and early anatomy is normal. Amnion: visualized and appears normal Right Ovary is normal in appearance. Left Ovary is normal appearance. Corpus luteal cyst:  Right ovary Survey of the adnexa demonstrates no adnexal masses. There is no free peritoneal fluid in the cul de sac. Impression: 1. [redacted]w[redacted]d Viable Singleton Intrauterine pregnancy by U/S. 2. (U/S) EDD is consistent with Clinically established EDD of 03/01/2020. Recommendations: 1.Clinical correlation with the patient's History and Physical Exam. Gweneth Dimitri, RT There is a viable singleton gestation.  The fetal biometry correlates with established dating. Detailed evaluation of the fetal anatomy is precluded by early gestational age.  It must be noted that a normal ultrasound particular at this early gestational age is unable to rule out fetal aneuploidy, risk of first trimester miscarriage, or anatomic birth defects. Malachy Mood, MD, [facility], [facility] 08/03/2019, 9:43 AM     Assessment   28 y.o. G2P1001 at [redacted]w[redacted]d by  03/01/2020, by Last Menstrual Period presenting for routine prenatal visit  Plan   pregnancy Problems (from 07/25/19 to present)    No problems associated with this episode.       Gestational age appropriate obstetric precautions including but not limited to vaginal bleeding, contractions, leaking of fluid and fetal movement were reviewed in detail with the patient.    No follow-ups on file.  Malachy Mood, MD, [facility], [facility] 08/03/2019, 9:37 AM

## 2019-08-06 LAB — RPR+RH+ABO+RUB AB+AB SCR+CB...
Antibody Screen: NEGATIVE
HIV Screen 4th Generation wRfx: NONREACTIVE
Hematocrit: 35.8 % (ref 34.0–46.6)
Hemoglobin: 12 g/dL (ref 11.1–15.9)
Hepatitis B Surface Ag: NEGATIVE
MCH: 31.3 pg (ref 26.6–33.0)
MCHC: 33.5 g/dL (ref 31.5–35.7)
MCV: 93 fL (ref 79–97)
Platelets: 166 10*3/uL (ref 150–450)
RBC: 3.84 x10E6/uL (ref 3.77–5.28)
RDW: 13.3 % (ref 11.7–15.4)
RPR Ser Ql: NONREACTIVE
Rh Factor: POSITIVE
Rubella Antibodies, IGG: 16.6 index (ref >0.99)
Varicella zoster IgG: 3785 index (ref >165)
WBC: 6.4 10*3/uL (ref 3.4–10.8)

## 2019-08-06 LAB — PROLACTIN: Prolactin: 29.6 ng/mL — ABNORMAL HIGH (ref 4.8–23.3)

## 2019-08-06 LAB — THYROID PANEL WITH TSH
Free Thyroxine Index: 1.8 (ref 1.2–4.9)
T3 Uptake Ratio: 17 % — ABNORMAL LOW (ref 24–39)
T4, Total: 10.7 ug/dL (ref 4.5–12.0)
TSH: 0.035 u[IU]/mL — ABNORMAL LOW (ref 0.450–4.500)

## 2019-08-06 LAB — HEMOGLOBINOPATHY EVALUATION
HGB C: 0 %
HGB S: 0 %
HGB VARIANT: 0 %
Hemoglobin A2 Quantitation: 2.2 % (ref 1.8–3.2)
Hemoglobin F Quantitation: 2.2 % — ABNORMAL HIGH (ref 0.0–2.0)
Hgb A: 95.6 % — ABNORMAL LOW (ref 96.4–98.8)

## 2019-08-06 LAB — GLUCOSE, 1 HOUR GESTATIONAL: Gestational Diabetes Screen: 105 mg/dL (ref 65–139)

## 2019-08-06 LAB — COMPREHENSIVE METABOLIC PANEL
ALT: 7 IU/L (ref 0–32)
AST: 14 IU/L (ref 0–40)
Albumin/Globulin Ratio: 1.5 (ref 1.2–2.2)
Albumin: 3.8 g/dL — ABNORMAL LOW (ref 3.9–5.0)
Alkaline Phosphatase: 94 IU/L (ref 39–117)
BUN/Creatinine Ratio: 19 (ref 9–23)
BUN: 11 mg/dL (ref 6–20)
Bilirubin Total: 0.5 mg/dL (ref 0.0–1.2)
CO2: 21 mmol/L (ref 20–29)
Calcium: 9.1 mg/dL (ref 8.7–10.2)
Chloride: 105 mmol/L (ref 96–106)
Creatinine, Ser: 0.59 mg/dL (ref 0.57–1.00)
GFR calc Af Amer: 147 mL/min/{1.73_m2} (ref >59)
GFR calc non Af Amer: 128 mL/min/{1.73_m2} (ref >59)
Globulin, Total: 2.5 g/dL (ref 1.5–4.5)
Glucose: 100 mg/dL — ABNORMAL HIGH (ref 65–99)
Potassium: 4.3 mmol/L (ref 3.5–5.2)
Sodium: 137 mmol/L (ref 134–144)
Total Protein: 6.3 g/dL (ref 6.0–8.5)

## 2019-08-07 ENCOUNTER — Emergency Department: Payer: Medicaid Other

## 2019-08-07 ENCOUNTER — Encounter: Payer: Self-pay | Admitting: Emergency Medicine

## 2019-08-07 ENCOUNTER — Ambulatory Visit
Admission: EM | Admit: 2019-08-07 | Discharge: 2019-08-07 | Disposition: A | Discharge status: Other Acute Inpatient Hospital | Payer: Medicaid Other | Attending: Family Medicine | Admitting: Family Medicine

## 2019-08-07 ENCOUNTER — Emergency Department
Admission: EM | Admit: 2019-08-07 | Discharge: 2019-08-07 | Disposition: A | Discharge status: Home / Self Care | Payer: Medicaid Other | Attending: Emergency Medicine | Admitting: Emergency Medicine

## 2019-08-07 ENCOUNTER — Other Ambulatory Visit: Payer: Self-pay

## 2019-08-07 ENCOUNTER — Encounter: Payer: Self-pay | Admitting: Intensive Care

## 2019-08-07 DIAGNOSIS — S8002XA Contusion of left knee, initial encounter: Secondary | ICD-10-CM | POA: Diagnosis not present

## 2019-08-07 DIAGNOSIS — N390 Urinary tract infection, site not specified: Secondary | ICD-10-CM | POA: Insufficient documentation

## 2019-08-07 DIAGNOSIS — Z3A10 10 weeks gestation of pregnancy: Secondary | ICD-10-CM | POA: Diagnosis not present

## 2019-08-07 DIAGNOSIS — O10019 Pre-existing essential hypertension complicating pregnancy, unspecified trimester: Secondary | ICD-10-CM | POA: Insufficient documentation

## 2019-08-07 DIAGNOSIS — O0991 Supervision of high risk pregnancy, unspecified, first trimester: Secondary | ICD-10-CM | POA: Diagnosis not present

## 2019-08-07 DIAGNOSIS — Y939 Activity, unspecified: Secondary | ICD-10-CM | POA: Insufficient documentation

## 2019-08-07 DIAGNOSIS — Y929 Unspecified place or not applicable: Secondary | ICD-10-CM | POA: Diagnosis not present

## 2019-08-07 DIAGNOSIS — Z87891 Personal history of nicotine dependence: Secondary | ICD-10-CM | POA: Diagnosis not present

## 2019-08-07 DIAGNOSIS — R55 Syncope and collapse: Secondary | ICD-10-CM | POA: Insufficient documentation

## 2019-08-07 DIAGNOSIS — X58XXXA Exposure to other specified factors, initial encounter: Secondary | ICD-10-CM | POA: Insufficient documentation

## 2019-08-07 DIAGNOSIS — Z79899 Other long term (current) drug therapy: Secondary | ICD-10-CM | POA: Insufficient documentation

## 2019-08-07 DIAGNOSIS — Y999 Unspecified external cause status: Secondary | ICD-10-CM | POA: Diagnosis not present

## 2019-08-07 LAB — CBC
HCT: 37.4 % (ref 36.0–46.0)
Hemoglobin: 12.3 g/dL (ref 12.0–15.0)
MCH: 30.5 pg (ref 26.0–34.0)
MCHC: 32.9 g/dL (ref 30.0–36.0)
MCV: 92.8 fL (ref 80.0–100.0)
Platelets: 174 10*3/uL (ref 150–400)
RBC: 4.03 MIL/uL (ref 3.87–5.11)
RDW: 12.5 % (ref 11.5–15.5)
WBC: 7.4 10*3/uL (ref 4.0–10.5)
nRBC: 0 % (ref 0.0–0.2)

## 2019-08-07 LAB — BASIC METABOLIC PANEL
Anion gap: 8 (ref 5–15)
BUN: 10 mg/dL (ref 6–20)
CO2: 23 mmol/L (ref 22–32)
Calcium: 9.3 mg/dL (ref 8.9–10.3)
Chloride: 104 mmol/L (ref 98–111)
Creatinine, Ser: 0.52 mg/dL (ref 0.44–1.00)
GFR calc Af Amer: >60 mL/min (ref >60)
GFR calc non Af Amer: >60 mL/min (ref >60)
Glucose, Bld: 92 mg/dL (ref 70–99)
Potassium: 4.2 mmol/L (ref 3.5–5.1)
Sodium: 135 mmol/L (ref 135–145)

## 2019-08-07 LAB — URINALYSIS, COMPLETE (UACMP) WITH MICROSCOPIC
Bilirubin Urine: NEGATIVE
Glucose, UA: NEGATIVE mg/dL
Hgb urine dipstick: NEGATIVE
Ketones, ur: NEGATIVE mg/dL
Nitrite: NEGATIVE
Protein, ur: 30 mg/dL — AB
Specific Gravity, Urine: 1.02 (ref 1.005–1.030)
pH: 7 (ref 5.0–8.0)

## 2019-08-07 LAB — HCG, QUANTITATIVE, PREGNANCY: hCG, Beta Chain, Quant, S: 153322 m[IU]/mL — ABNORMAL HIGH (ref <5)

## 2019-08-07 MED ORDER — ACETAMINOPHEN 325 MG PO TABS: 650.0000 mg | ORAL_TABLET | Freq: Once | ORAL | Status: DC

## 2019-08-07 MED ORDER — ACETAMINOPHEN 500 MG PO TABS
1000.0000 mg | ORAL_TABLET | Freq: Once | ORAL | Status: AC
  Administered 2019-08-07: 1000 mg via ORAL
  Filled 2019-08-07: qty 2

## 2019-08-07 MED ORDER — SODIUM CHLORIDE 0.9 % IV BOLUS
1000.0000 mL | Freq: Once | INTRAVENOUS | Status: AC
  Administered 2019-08-07: 11:00:00 1000 mL via INTRAVENOUS

## 2019-08-07 MED ORDER — NITROFURANTOIN MONOHYD MACRO 100 MG PO CAPS: 100.0000 mg | ORAL_CAPSULE | Freq: Two times a day (BID) | ORAL | 0 refills | Status: AC

## 2019-08-07 NOTE — ED Triage Notes (Signed)
Pt states that she passed out yesterday, while she was at work, and she has been having pain in her knee, lightheadedness and left arm pain. She did not seek medical attention yesterday. She is 2 months pregnant.

## 2019-08-07 NOTE — Discharge Instructions (Signed)
Take the antibiotic as prescribed and finish the full 7-day course.  Make sure to drink plenty of fluids and get rest.  You likely have a contusion or soft tissue injury of the knee.  We have provided information for 1 of the area orthopedists that you can follow-up with if you have persistent symptoms there.  Follow-up with your OB/GYN and regular doctor as scheduled.  Return to the ER for new, worsening, persistent weakness or lightheadedness, fevers, abdominal pain, vomiting, or any other new or worsening symptoms that concern you.

## 2019-08-07 NOTE — ED Provider Notes (Signed)
[facility]    CSN: [id number] Arrival date & time: 08/07/19  7867  History   Chief Complaint Chief Complaint  Patient presents with  . Loss of Consciousness   HPI  28 year old female presents with recent syncope.  Patient reports that she fainted yesterday.  Patient states that she was at work and was returning from her break.  She states that she "felt shaky".  Denies preceding nausea.  No recent vomiting.  Patient states that she "blacked out".  She states that it was witnessed by Production designer, theatre/television/film at work.  She does not believe that she suffered a head injury.  She is unsure of the length of time that she was out.  She was not evaluated by EMS or another healthcare professional.  She subsequently went home.  Patient states that in addition to her recent syncopal episode, she has had a one-week history of left knee pain.  Left knee pain is located anteriorly.  She states that she fell in the bathtub approximately 2 months ago and is unsure if this is contributing.  She is unsure if she injured her knee when she had a syncopal episode.  Also, patient reports left arm pain.  She reports left wrist pain extending to the left elbow.  It has been intermittent for the past 2 months.  No medications or interventions tried.  No other complaints.  PMH, Surgical Hx, Family Hx, Social History reviewed and updated as below.  Past Medical History:  Diagnosis Date  . Hypertension   . Hypertension affecting pregnancy    Patient Active Problem List   Diagnosis Date Noted  . Supervision of high risk pregnancy, antepartum 07/25/2019  . Obesity affecting pregnancy, antepartum 07/25/2019  . BMI 40.0-44.9, adult ([facility]) 07/25/2019  . Hyperprolactinemia ([facility]) 12/02/2017  . 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 Subclinical hyperthyroidism 09/07/2017   Past Surgical History:  Procedure Laterality Date  . HERNIA REPAIR     OB History    Gravida  2   Para  1   Term  1   Preterm      AB      Living  1     SAB      TAB      Ectopic      Multiple      Live Births  1          Home Medications    Prior to Admission medications   Medication Sig Start Date End Date Taking? Authorizing Provider  Prenatal Vit-Fe Fumarate-FA (MULTIVITAMIN-PRENATAL) 27-0.8 MG TABS tablet Take 1 tablet by mouth daily at 12 noon.   Yes [provider]    Family History Hypertension Father    Hernia Mother    Hypertension Paternal Grandmother    Asthma Sister    Breast cancer Neg Hx    Cervical cancer Neg Hx    Colon cancer Neg Hx    Ovarian cancer Neg Hx    Uterine cancer Neg Hx      Social History Social History   Tobacco Use  . Smoking status: Former Smoker    Packs/day: 0.50    Types: Cigarettes  . Smokeless tobacco: Never Used  Substance Use Topics  . Alcohol use: No  . Drug use: Never    Allergies   Patient has no known allergies.   Review of Systems Review of Systems  Musculoskeletal:       Left knee pain, left arm pain.  Neurological: Positive for dizziness and syncope.  Physical Exam Triage Vital Signs ED Triage Vitals  Enc Vitals Group     BP 08/07/19 0830 122/80     Pulse Rate 08/07/19 0830 (!) 103     Resp 08/07/19 0830 18     Temp 08/07/19 0830 98.3 F (36.8 C)     Temp Source 08/07/19 0830 Oral     SpO2 08/07/19 0830 96 %     Weight 08/07/19 0831 244 lb (110.7 kg)     Height 08/07/19 0831 5\' 4"  (1.626 m)     Head Circumference --      Peak Flow --      Pain Score 08/07/19 0829 9     Pain Loc --      Pain Edu? --      Excl. in GC? --    No data found.  Updated Vital Signs BP 122/80 (BP Location: Left Arm)   Pulse (!) 103   Temp 98.3 F (36.8 C) (Oral)   Resp 18   Ht 5\' 4"  (1.626 m)   Wt 110.7 kg   LMP 05/26/2019 (Exact Date)   SpO2 96%   BMI 41.88 kg/m   Visual Acuity Right Eye Distance:   Left Eye Distance:   Bilateral Distance:    Right Eye Near:   Left Eye Near:    Bilateral Near:      Physical Exam Vitals signs and nursing note reviewed.  Constitutional:      General: She is not in acute distress.    Appearance: Normal appearance. She is obese. She is not ill-appearing.  HENT:     Head: Normocephalic and atraumatic.  Eyes:     General:        Right eye: No discharge.        Left eye: No discharge.     Conjunctiva/sclera: Conjunctivae normal.  Cardiovascular:     Rate and Rhythm: Normal rate and regular rhythm.     Heart sounds: No murmur.  Pulmonary:     Effort: Pulmonary effort is normal.     Breath sounds: Normal breath sounds. No wheezing, rhonchi or rales.  Musculoskeletal:     Comments: Left wrist exquisitely tender to palpation diffusely.  No appreciable swelling.  Left elbow tender to palpation diffusely with minimal force applied.  Left knee -ligaments intact.  No appreciable swelling/effusion.  Diffusely tender to palpation.  Neurological:     Mental Status: She is alert and oriented to person, place, and time.     Comments: No apparent focal deficits.  Psychiatric:        Mood and Affect: Mood normal.        Behavior: Behavior normal.    UC Treatments / Results  Labs (all labs ordered are listed, but only abnormal results are displayed) Labs Reviewed - No data to display  EKG   Radiology No results found.  Procedures Procedures (including critical care time)  Medications Ordered in UC Medications - No data to display  Initial Impression / Assessment and Plan / UC Course  I have reviewed the triage vital signs and the nursing notes.  Pertinent labs & imaging results that were available during my care of the patient were reviewed by me and considered in my medical decision making (see chart for details).    28 year old female presents with an unprovoked syncopal episode.  Patient is currently pregnant.  High risk pregnancy.  Given loss of consciousness and unknown length of time that she was down in addition to the  fact that she is  pregnant with a high risk pregnancy, I have advised her to go to the ER for further evaluation and work-up.  Final Clinical Impressions(s) / UC Diagnoses   Final diagnoses:  Syncope, unspecified syncope type  High-risk pregnancy in first trimester     Discharge Instructions     Patient had unprovoked syncope yesterday.  Given this and high risk pregnancy, I recommended that she have work up and further evaluation in the ER.  Dr. Lacinda Axon     ED Prescriptions    None     PDMP not reviewed this encounter.   Coral Spikes, [place] 08/07/19 [phone]

## 2019-08-07 NOTE — ED Triage Notes (Signed)
Patient reports "blacking out" yesterday while at work. Denies falling. Reports her manager caught her. C/o pain left knee, right breast, and abd pain. Patient reports she is 9w 6d pregnant.

## 2019-08-07 NOTE — ED Provider Notes (Signed)
[facility] Emergency Department Provider Note ____________________________________________   First MD Initiated Contact with Patient 08/07/19 1029     (approximate)  I have reviewed the triage vital signs and the nursing notes.   HISTORY  Chief Complaint Loss of Consciousness    HPI Janice Mccall is a 28 y.o. female with PMH as noted below and who is currently [redacted] weeks pregnant who presents with syncope yesterday.  Patient states that she was at work.  She felt somewhat lightheaded and shaky for a few minutes and then passed out.  She states that her manager caught her and she did not fall or hit the ground.  She states that she has been feeling mildly lightheaded, and has mild epigastric discomfort.  She denies vomiting or diarrhea.  She denies any lower abdominal pain, vaginal bleeding, or other pregnancy related symptoms. She also reports some left knee pain after an injury a few weeks ago but has been able to ambulate on it without difficulty.  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 Authorizing Provider  nitrofurantoin, macrocrystal-monohydrate, (MACROBID) 100 MG capsule Take 1 capsule (100 mg total) by mouth 2 (two) times daily for 7 days. 08/07/19 08/14/19  Dionne BucySiadecki, Anzal Bartnick, MD  Prenatal Vit-Fe Fumarate-FA (MULTIVITAMIN-PRENATAL) 27-0.8 MG TABS tablet Take 1 tablet by mouth daily at 12 noon.    [provider]    Allergies Patient has no known allergies.  History reviewed. No pertinent family history.  Social History Social History   Tobacco Use   Smoking status: Former Smoker    Packs/day: 0.50    Types: Cigarettes   Smokeless tobacco: Never Used  Substance Use Topics   Alcohol use: No   Drug use: Never    Review of Systems  Constitutional: No fever. Eyes: No visual changes. ENT: No sore throat. Cardiovascular: Denies chest pain. Respiratory: Denies shortness of breath. Gastrointestinal: No vomiting or diarrhea.  Genitourinary: Negative for dysuria.  No vaginal bleeding. Musculoskeletal: Negative for back pain.  Positive for left knee pain. Skin: Negative for rash. Neurological: Negative for headache.   ____________________________________________   PHYSICAL EXAM:  VITAL SIGNS: ED Triage Vitals [08/07/19 0950]  Enc Vitals Group     BP 128/74     Pulse Rate 93     Resp 14     Temp 98.2 F (36.8 C)     Temp Source Oral     SpO2 99 %     Weight 244 lb (110.7 kg)     Height 5\' 4"  (1.626 m)     Head Circumference      Peak Flow      Pain Score 8     Pain Loc      Pain Edu?      Excl. in GC?     Constitutional: Alert and oriented. Well appearing and in no acute distress. Eyes: Conjunctivae are normal.  Head: Atraumatic. Nose: No congestion/rhinnorhea. Mouth/Throat: Mucous membranes are moist.  Neck: Normal range of motion.  Cardiovascular: Good peripheral circulation. Respiratory: Normal respiratory effort.  No retractions.  Gastrointestinal: Soft and nontender. No distention.  Genitourinary: No flank tenderness. Musculoskeletal: No lower extremity edema.  Extremities warm and well perfused.  Neurologic:  Normal speech and language.  Motor intact in all extremities.  Normal coordination.  No gross focal neurologic deficits are appreciated.  Skin:  Skin is warm and dry. No rash noted. Psychiatric: Mood and affect are normal. Speech and  behavior are normal.  ____________________________________________   LABS (all labs ordered are listed, but only abnormal results are displayed)  Labs Reviewed  URINALYSIS, COMPLETE (UACMP) WITH MICROSCOPIC - Abnormal; Notable for the following components:      Result Value   Color, Urine YELLOW (*)    APPearance CLOUDY (*)    Protein, ur 30 (*)    Leukocytes,Ua TRACE (*)    Bacteria, UA RARE (*)    All other components within normal limits  HCG, QUANTITATIVE, PREGNANCY - Abnormal; Notable for the following components:   hCG, Beta Chain, Quant, [place] 153,322 (*)    All other components within normal limits  BASIC METABOLIC PANEL  CBC   ____________________________________________  EKG  ED ECG REPORT I, Arta Silence, the attending physician, personally viewed and interpreted this ECG.  Date: 08/07/2019 EKG Time: 0959 Rate: 83 Rhythm: normal sinus rhythm QRS Axis: normal Intervals: normal ST/T Wave abnormalities: normal Narrative Interpretation: no evidence of acute ischemia  ____________________________________________  RADIOLOGY  XR L knee: No acute fracture  ____________________________________________   PROCEDURES  Procedure(s) performed: No  Procedures  Critical Care performed: No ____________________________________________   INITIAL IMPRESSION / ASSESSMENT AND PLAN / ED COURSE  Pertinent labs & imaging results that were available during my care of the patient were reviewed by me and considered in my medical decision making (see chart for details).  28 year old female who is approximately [redacted] weeks pregnant presents for evaluation after a syncopal event yesterday, with a prodrome of lightheadedness.  The patient incidentally reports left knee pain from a fall about a month ago which she never got checked out.  She states that she is feeling relatively well today.  She reports mild epigastric discomfort but denies any lower abdominal pain or  bleeding.  I reviewed the past medical records in [place].  The patient had an ultrasound performed on 08/03/2019 showing a live IUP.  On exam today, the patient is well-appearing.  Her vital signs are normal.  The abdomen is soft and nontender.  Neurologic exam is nonfocal.  EKG shows no significant abnormalities.  Overall presentation is consistent with a vasovagal syncope.  We will obtain lab work-up and a UA to evaluate for any possible precipitating causes.  There is no evidence of cardiac etiology.  At this time, the patient has no pregnancy related symptoms and especially given the reassuring ultrasound performed 4 days ago, there is no indication for repeat imaging at this time.  ----------------------------------------- 2:04 PM on 08/07/2019 -----------------------------------------  Lab work-up is reassuring.  However, the urinalysis shows findings consistent with a possible UTI which certainly could contribute to a possible syncopal episode.  I will discharge with a prescription for Macrobid.  The patient has no symptoms of pyelonephritis.  The knee x-ray is negative.  At this time, the patient is stable for discharge home.  Return precautions given, she expresses understanding.  ____________________________________________   FINAL CLINICAL IMPRESSION(S) / ED DIAGNOSES  Final diagnoses:  Syncope, unspecified syncope type  Urinary tract  infection without hematuria, site unspecified  Contusion of left knee, initial encounter      NEW MEDICATIONS STARTED DURING THIS VISIT:  New Prescriptions   NITROFURANTOIN, MACROCRYSTAL-MONOHYDRATE, (MACROBID) 100 MG CAPSULE    Take 1 capsule (100 mg total) by mouth 2 (two) times daily for 7 days.     Note:  This document was prepared using Dragon voice recognition software and may include unintentional dictation errors.    Dionne Bucy, MD 08/07/19 1404

## 2019-08-07 NOTE — Discharge Instructions (Signed)
Patient had unprovoked syncope yesterday.  Given this and high risk pregnancy, I recommended that she have work up and further evaluation in the ER.  Dr. Lacinda Axon

## 2019-08-15 ENCOUNTER — Encounter: Payer: Self-pay | Admitting: Emergency Medicine

## 2019-08-15 ENCOUNTER — Emergency Department
Admission: EM | Admit: 2019-08-15 | Discharge: 2019-08-15 | Disposition: A | Discharge status: Home / Self Care | Payer: Medicaid Other | Attending: Emergency Medicine | Admitting: Emergency Medicine

## 2019-08-15 ENCOUNTER — Other Ambulatory Visit: Payer: Self-pay

## 2019-08-15 DIAGNOSIS — O1201 Gestational edema, first trimester: Secondary | ICD-10-CM | POA: Insufficient documentation

## 2019-08-15 DIAGNOSIS — R519 Headache, unspecified: Secondary | ICD-10-CM | POA: Diagnosis not present

## 2019-08-15 DIAGNOSIS — I10 Essential (primary) hypertension: Secondary | ICD-10-CM | POA: Insufficient documentation

## 2019-08-15 DIAGNOSIS — O26891 Other specified pregnancy related conditions, first trimester: Secondary | ICD-10-CM | POA: Insufficient documentation

## 2019-08-15 DIAGNOSIS — O219 Vomiting of pregnancy, unspecified: Secondary | ICD-10-CM | POA: Insufficient documentation

## 2019-08-15 DIAGNOSIS — Z79899 Other long term (current) drug therapy: Secondary | ICD-10-CM | POA: Insufficient documentation

## 2019-08-15 DIAGNOSIS — Z3A11 11 weeks gestation of pregnancy: Secondary | ICD-10-CM | POA: Insufficient documentation

## 2019-08-15 DIAGNOSIS — Z87891 Personal history of nicotine dependence: Secondary | ICD-10-CM | POA: Insufficient documentation

## 2019-08-15 LAB — COMPREHENSIVE METABOLIC PANEL
ALT: 14 U/L (ref 0–44)
AST: 18 U/L (ref 15–41)
Albumin: 3.5 g/dL (ref 3.5–5.0)
Alkaline Phosphatase: 72 U/L (ref 38–126)
Anion gap: 9 (ref 5–15)
BUN: 10 mg/dL (ref 6–20)
CO2: 22 mmol/L (ref 22–32)
Calcium: 9.8 mg/dL (ref 8.9–10.3)
Chloride: 106 mmol/L (ref 98–111)
Creatinine, Ser: 0.69 mg/dL (ref 0.44–1.00)
GFR calc Af Amer: >60 mL/min (ref >60)
GFR calc non Af Amer: >60 mL/min (ref >60)
Glucose, Bld: 130 mg/dL — ABNORMAL HIGH (ref 70–99)
Potassium: 3.9 mmol/L (ref 3.5–5.1)
Sodium: 137 mmol/L (ref 135–145)
Total Bilirubin: 0.7 mg/dL (ref 0.3–1.2)
Total Protein: 7.6 g/dL (ref 6.5–8.1)

## 2019-08-15 LAB — POCT PREGNANCY, URINE: Preg Test, Ur: POSITIVE — AB

## 2019-08-15 LAB — CBC
HCT: 36.2 % (ref 36.0–46.0)
Hemoglobin: 12.1 g/dL (ref 12.0–15.0)
MCH: 30.6 pg (ref 26.0–34.0)
MCHC: 33.4 g/dL (ref 30.0–36.0)
MCV: 91.4 fL (ref 80.0–100.0)
Platelets: 172 10*3/uL (ref 150–400)
RBC: 3.96 MIL/uL (ref 3.87–5.11)
RDW: 12.2 % (ref 11.5–15.5)
WBC: 7.2 10*3/uL (ref 4.0–10.5)
nRBC: 0 % (ref 0.0–0.2)

## 2019-08-15 LAB — LIPASE, BLOOD: Lipase: 23 U/L (ref 11–51)

## 2019-08-15 MED ORDER — BUTALBITAL-APAP-CAFFEINE 50-325-40 MG PO TABS
1.0000 | ORAL_TABLET | Freq: Once | ORAL | Status: AC
  Administered 2019-08-15: 1 via ORAL
  Filled 2019-08-15: qty 1

## 2019-08-15 MED ORDER — METOCLOPRAMIDE HCL 10 MG PO TABS
10.0000 mg | ORAL_TABLET | Freq: Once | ORAL | Status: AC
  Administered 2019-08-15: 10 mg via ORAL
  Filled 2019-08-15: qty 1

## 2019-08-15 NOTE — Discharge Instructions (Addendum)
Please seek medical attention for any high fevers, chest pain, shortness of breath, change in behavior, persistent vomiting, bloody stool or any other new or concerning symptoms.  

## 2019-08-15 NOTE — ED Triage Notes (Addendum)
Patient reports N/V/D x2 weeks. Reports she cannot keep anything down. Patient also reports abdominal pain and back pain. Patient states she is [redacted] weeks pregnant

## 2019-08-15 NOTE — ED Provider Notes (Signed)
[facility] Emergency Department Provider Note   ____________________________________________   I have reviewed the triage vital signs and the nursing notes.   HISTORY  Chief Complaint Emesis   History limited by: Not Limited   HPI Janice Mccall is a 28 y.o. female at roughly [redacted] weeks pregnant, who presents to the emergency department today with primary concern for nausea vomiting and headache. She states that the headache has been present for roughly 12 days. She has tried tylenol without any significant relief. Denies any trauma prior to the headache starting. The patient says she does have history of migraines. The patient also has complaints of nausea and vomiting. She denies taking any medications to help with the nausea or vomiting. The patient has had some lower abdominal pain. Also complaining of some left foot swelling after wearing flip flops. Denies any pain.   Records reviewed. Per medical record review patient has a history of being [redacted] weeks pregnant by [place]. Followed by [facility] Ob[facility].  Past Medical History:  Diagnosis Date  . Hypertension   . Hypertension affecting pregnancy     Patient Active Problem List   Diagnosis Date Noted  . Supervision of high risk pregnancy, antepartum 07/25/2019  . Obesity affecting pregnancy, antepartum 07/25/2019  . BMI 40.0-44.9, adult (HCC) 07/25/2019  . Hyperprolactinemia (HCC) 12/02/2017  . 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 Subclinical hyperthyroidism 09/07/2017    Past Surgical History:  Procedure Laterality Date  . HERNIA REPAIR      Prior to Admission medications   Medication Sig Start Date End Date Taking? Authorizing Provider  Prenatal Vit-Fe Fumarate-FA (MULTIVITAMIN-PRENATAL) 27-0.8 MG TABS tablet Take 1 tablet by mouth daily at 12 noon.    [provider]    Allergies Patient has no known allergies.  No family history on file.  Social History Social History   Tobacco  Use  . Smoking status: Former Smoker    Packs/day: 0.50    Types: Cigarettes  . Smokeless tobacco: Never Used  Substance Use Topics  . Alcohol use: No  . Drug use: Never    Review of Systems Constitutional: No fever/chills Eyes: No visual changes. ENT: No sore throat. Cardiovascular: Denies chest pain. Respiratory: Denies shortness of breath. Gastrointestinal: Positive for lower abdominal pain, nausea and vomiting.  Genitourinary: Negative for dysuria. Musculoskeletal: Positive for left foot swelling.  Skin: Negative for rash. Neurological: Positive for headache.   ____________________________________________   PHYSICAL EXAM:  VITAL SIGNS: ED Triage Vitals  Enc Vitals Group     BP 08/15/19 1333 122/61     Pulse Rate 08/15/19 1333 (!) 105     Resp 08/15/19 1333 16     Temp 08/15/19 1333 99 F (37.2 C)     Temp Source 08/15/19 1333 Oral     SpO2 08/15/19 1333 98 %     Weight 08/15/19 1334 240 lb (108.9 kg)     Height 08/15/19 1334 5\' 4"  (1.626 m)     Head Circumference --      Peak Flow --      Pain Score 08/15/19 1334 10   Constitutional: Alert and oriented.  Eyes: Conjunctivae are normal.  ENT      Head: Normocephalic and atraumatic.      Nose: No congestion/rhinnorhea.      Mouth/Throat: Mucous membranes are moist.      Neck: No stridor. Hematological/Lymphatic/Immunilogical: No cervical lymphadenopathy. Cardiovascular: Normal rate, regular rhythm.  No murmurs, rubs, or gallops.  Respiratory: Normal respiratory effort  without tachypnea nor retractions. Breath sounds are clear and equal bilaterally. No wheezes/rales/rhonchi. Gastrointestinal: Soft and non tender. No rebound. No guarding.  Genitourinary: Deferred Musculoskeletal: Normal range of motion in all extremities. No lower extremity edema. No swelling appreciated to the left foot. No bruising. No tenderness.  Neurologic:  Normal speech and language. No gross focal neurologic deficits are appreciated.   Skin:  Skin is warm, dry and intact. No rash noted. Psychiatric: Mood and affect are normal. Speech and behavior are normal. Patient exhibits appropriate insight and judgment.  ____________________________________________    LABS (pertinent positives/negatives)  Upreg positive Lipase 23 CBC wbc 7.2, hgb 12.1, plt 172 CMP wnl except glu 130  ____________________________________________   EKG  None  ____________________________________________    RADIOLOGY  None  ____________________________________________   PROCEDURES  Procedures  ____________________________________________   INITIAL IMPRESSION / ASSESSMENT AND PLAN / ED COURSE  Pertinent labs & imaging results that were available during my care of the patient were reviewed by me and considered in my medical decision making (see chart for details).   Patient presented to the emergency department today with concerns for initial abdominal pain.  Patient's blood work without any concerning leukocytosis or electrolyte abnormality.  Creatinine is not elevated.  Patient was given medications emergency department and did feel better afterwards.  Discussed with patient importance of following up with primary care.  ____________________________________________   FINAL CLINICAL IMPRESSION(S) / ED DIAGNOSES  Final diagnoses:  Bad headache     Note: This dictation was prepared with Dragon dictation. Any transcriptional errors that result from this process are unintentional     Nance Pear, MD 08/15/19 1529

## 2019-08-17 ENCOUNTER — Other Ambulatory Visit: Payer: Self-pay

## 2019-08-17 ENCOUNTER — Encounter: Payer: Self-pay | Admitting: Maternal Newborn

## 2019-08-17 ENCOUNTER — Ambulatory Visit (INDEPENDENT_AMBULATORY_CARE_PROVIDER_SITE_OTHER): Payer: Medicaid Other | Admitting: Maternal Newborn

## 2019-08-17 ENCOUNTER — Other Ambulatory Visit: Payer: Self-pay | Admitting: Advanced Practice Midwife

## 2019-08-17 VITALS — BP 120/80 | Wt 241.0 lb

## 2019-08-17 DIAGNOSIS — O0990 Supervision of high risk pregnancy, unspecified, unspecified trimester: Secondary | ICD-10-CM

## 2019-08-17 DIAGNOSIS — O219 Vomiting of pregnancy, unspecified: Secondary | ICD-10-CM

## 2019-08-17 DIAGNOSIS — O0991 Supervision of high risk pregnancy, unspecified, first trimester: Secondary | ICD-10-CM

## 2019-08-17 DIAGNOSIS — Z3A11 11 weeks gestation of pregnancy: Secondary | ICD-10-CM

## 2019-08-17 MED ORDER — PROCHLORPERAZINE MALEATE 10 MG PO TABS: 10.0000 mg | ORAL_TABLET | Freq: Four times a day (QID) | ORAL | 0 refills | Status: DC | PRN

## 2019-08-17 NOTE — Patient Instructions (Signed)
First Trimester of Pregnancy The first trimester of pregnancy is from week 1 until the end of week 13 (months 1 through 3). A week after a sperm fertilizes an egg, the egg will implant on the wall of the uterus. This embryo will begin to develop into a baby. Genes from you and your partner will form the baby. The female genes will determine whether the baby will be a boy or a girl. At 6-8 weeks, the eyes and face will be formed, and the heartbeat can be seen on ultrasound. At the end of 12 weeks, all the baby's organs will be formed. Now that you are pregnant, you will want to do everything you can to have a healthy baby. Two of the most important things are to get good prenatal care and to follow your health care provider's instructions. Prenatal care is all the medical care you receive before the baby's birth. This care will help prevent, find, and treat any problems during the pregnancy and childbirth. Body changes during your first trimester Your body goes through many changes during pregnancy. The changes vary from woman to woman.  You may gain or lose a couple of pounds at first.  You may feel sick to your stomach (nauseous) and you may throw up (vomit). If the vomiting is uncontrollable, call your health care provider.  You may tire easily.  You may develop headaches that can be relieved by medicines. All medicines should be approved by your health care provider.  You may urinate more often. Painful urination may mean you have a bladder infection.  You may develop heartburn as a result of your pregnancy.  You may develop constipation because certain hormones are causing the muscles that push stool through your intestines to slow down.  You may develop hemorrhoids or swollen veins (varicose veins).  Your breasts may begin to grow larger and become tender. Your nipples may stick out more, and the tissue that surrounds them (areola) may become darker.  Your gums may bleed and may be  sensitive to brushing and flossing.  Dark spots or blotches (chloasma, mask of pregnancy) may develop on your face. This will likely fade after the baby is born.  Your menstrual periods will stop.  You may have a loss of appetite.  You may develop cravings for certain kinds of food.  You may have changes in your emotions from day to day, such as being excited to be pregnant or being concerned that something may go wrong with the pregnancy and baby.  You may have more vivid and strange dreams.  You may have changes in your hair. These can include thickening of your hair, rapid growth, and changes in texture. Some women also have hair loss during or after pregnancy, or hair that feels dry or thin. Your hair will most likely return to normal after your baby is born. What to expect at prenatal visits During a routine prenatal visit:  You will be weighed to make sure you and the baby are growing normally.  Your blood pressure will be taken.  Your abdomen will be measured to track your baby's growth.  The fetal heartbeat will be listened to between weeks 10 and 14 of your pregnancy.  Test results from any previous visits will be discussed. Your health care provider may ask you:  How you are feeling.  If you are feeling the baby move.  If you have had any abnormal symptoms, such as leaking fluid, bleeding, severe headaches, or abdominal   cramping.  If you are using any tobacco products, including cigarettes, chewing tobacco, and electronic cigarettes.  If you have any questions. Other tests that may be performed during your first trimester include:  Blood tests to find your blood type and to check for the presence of any previous infections. The tests will also be used to check for low iron levels (anemia) and protein on red blood cells (Rh antibodies). Depending on your risk factors, or if you previously had diabetes during pregnancy, you may have tests to check for high blood sugar  that affects pregnant women (gestational diabetes).  Urine tests to check for infections, diabetes, or protein in the urine.  An ultrasound to confirm the proper growth and development of the baby.  Fetal screens for spinal cord problems (spina bifida) and Down syndrome.  HIV (human immunodeficiency virus) testing. Routine prenatal testing includes screening for HIV, unless you choose not to have this test.  You may need other tests to make sure you and the baby are doing well. Follow these instructions at home: Medicines  Follow your health care provider's instructions regarding medicine use. Specific medicines may be either safe or unsafe to take during pregnancy.  Take a prenatal vitamin that contains at least 600 micrograms (mcg) of folic acid.  If you develop constipation, try taking a stool softener if your health care provider approves. Eating and drinking   Eat a balanced diet that includes fresh fruits and vegetables, whole grains, good sources of protein such as meat, eggs, or tofu, and low-fat dairy. Your health care provider will help you determine the amount of weight gain that is right for you.  Avoid raw meat and uncooked cheese. These carry germs that can cause birth defects in the baby.  Eating four or five small meals rather than three large meals a day may help relieve nausea and vomiting. If you start to feel nauseous, eating a few soda crackers can be helpful. Drinking liquids between meals, instead of during meals, also seems to help ease nausea and vomiting.  Limit foods that are high in fat and processed sugars, such as fried and sweet foods.  To prevent constipation: ? Eat foods that are high in fiber, such as fresh fruits and vegetables, whole grains, and beans. ? Drink enough fluid to keep your urine clear or pale yellow. Activity  Exercise only as directed by your health care provider. Most women can continue their usual exercise routine during  pregnancy. Try to exercise for 30 minutes at least 5 days a week. Exercising will help you: ? Control your weight. ? Stay in shape. ? Be prepared for labor and delivery.  Experiencing pain or cramping in the lower abdomen or lower back is a good sign that you should stop exercising. Check with your health care provider before continuing with normal exercises.  Try to avoid standing for long periods of time. Move your legs often if you must stand in one place for a long time.  Avoid heavy lifting.  Wear low-heeled shoes and practice good posture.  You may continue to have sex unless your health care provider tells you not to. Relieving pain and discomfort  Wear a good support bra to relieve breast tenderness.  Take warm sitz baths to soothe any pain or discomfort caused by hemorrhoids. Use hemorrhoid cream if your health care provider approves.  Rest with your legs elevated if you have leg cramps or low back pain.  If you develop varicose veins in   your legs, wear support hose. Elevate your feet for 15 minutes, 3-4 times a day. Limit salt in your diet. Prenatal care  Schedule your prenatal visits by the twelfth week of pregnancy. They are usually scheduled monthly at first, then more often in the last 2 months before delivery.  Write down your questions. Take them to your prenatal visits.  Keep all your prenatal visits as told by your health care provider. This is important. Safety  Wear your seat belt at all times when driving.  Make a list of emergency phone numbers, including numbers for family, friends, the hospital, and police and fire departments. General instructions  Ask your health care provider for a referral to a local prenatal education class. Begin classes no later than the beginning of month 6 of your pregnancy.  Ask for help if you have counseling or nutritional needs during pregnancy. Your health care provider can offer advice or refer you to specialists for help  with various needs.  Do not use hot tubs, steam rooms, or saunas.  Do not douche or use tampons or scented sanitary pads.  Do not cross your legs for long periods of time.  Avoid cat litter boxes and soil used by cats. These carry germs that can cause birth defects in the baby and possibly loss of the fetus by miscarriage or stillbirth.  Avoid all smoking, herbs, alcohol, and medicines not prescribed by your health care provider. Chemicals in these products affect the formation and growth of the baby.  Do not use any products that contain nicotine or tobacco, such as cigarettes and e-cigarettes. If you need help quitting, ask your health care provider. You may receive counseling support and other resources to help you quit.  Schedule a dentist appointment. At home, brush your teeth with a soft toothbrush and be gentle when you floss. Contact a health care provider if:  You have dizziness.  You have mild pelvic cramps, pelvic pressure, or nagging pain in the abdominal area.  You have persistent nausea, vomiting, or diarrhea.  You have a bad smelling vaginal discharge.  You have pain when you urinate.  You notice increased swelling in your face, hands, legs, or ankles.  You are exposed to fifth disease or chickenpox.  You are exposed to German measles (rubella) and have never had it. Get help right away if:  You have a fever.  You are leaking fluid from your vagina.  You have spotting or bleeding from your vagina.  You have severe abdominal cramping or pain.  You have rapid weight gain or loss.  You vomit blood or material that looks like coffee grounds.  You develop a severe headache.  You have shortness of breath.  You have any kind of trauma, such as from a fall or a car accident. Summary  The first trimester of pregnancy is from week 1 until the end of week 13 (months 1 through 3).  Your body goes through many changes during pregnancy. The changes vary from  woman to woman.  You will have routine prenatal visits. During those visits, your health care provider will examine you, discuss any test results you may have, and talk with you about how you are feeling. This information is not intended to replace advice given to you by your health care provider. Make sure you discuss any questions you have with your health care provider. Document Released: 10/05/2001 Document Revised: 09/23/2017 Document Reviewed: 09/22/2016 Elsevier Patient Education  2020 [employer]  

## 2019-08-17 NOTE — Progress Notes (Unsigned)
Referral sent for MFM consult regarding hyperthyroid, etc.

## 2019-08-17 NOTE — Progress Notes (Signed)
    Routine Prenatal Care Visit  Subjective  Janice Mccall is a 28 y.o. G2P1001 at [redacted]w[redacted]d being seen today for ongoing prenatal care.  She is currently monitored for the following issues for this high-risk pregnancy and has Hyperprolactinemia ([place]); Hypopituitarism ([place]); Subclinical hyperthyroidism; Supervision of high risk pregnancy, antepartum; Obesity affecting pregnancy, antepartum; and BMI 40.0-44.9, adult ([place]) on their problem list.  ----------------------------------------------------------------------------------- Patient reports vomiting and headaches. She is able to keep down liquids but not many foods; eating small portions. The headaches start at the back of her neck and radiate over the top of her head to the front. Tylenol has not been helping. Contractions: Not present. Vag. Bleeding: None.  No leaking of fluid.  ----------------------------------------------------------------------------------- The following portions of the patient's history were reviewed and updated as appropriate: allergies, current medications, past family history, past medical history, past social history, past surgical history and problem list. Problem list updated.  Objective  Blood pressure 120/80, weight 241 lb (109.3 kg), last menstrual period 05/26/2019. Pregravid weight 244 lb (110.7 kg) Total Weight Gain -3 lb (-1.361 kg)  Fetal Status: Fetal Heart Rate (bpm): 161         General:  Alert, oriented and cooperative. Patient is in no acute distress.  Skin: Skin is warm and dry. No rash noted.   Cardiovascular: Normal heart rate noted  Respiratory: Normal respiratory effort, no problems with respiration noted  Abdomen: Soft, gravid, appropriate for gestational age. Pain/Pressure: Present     Pelvic:  Cervical exam deferred        Extremities: Normal range of motion.     Mental Status: Normal mood and affect. Normal behavior. Normal judgment and thought content.     Assessment   28 y.o.  G2P1001 at [redacted]w[redacted]d, EDD 03/01/2020 by Last Menstrual Period presenting for a routine prenatal visit.  Plan   pregnancy Problems (from 07/25/19 to present)    No problems associated with this episode.    Discussed trying Compazine to control vomiting and also relief for headaches. May also use application of heat on neck if helpful.  Please refer to After Visit Summary for other counseling recommendations.   Return in about 4 weeks (around 09/14/2019) for ROB.  Avel Sensor, CNM 08/17/2019  10:40 AM

## 2019-08-20 ENCOUNTER — Other Ambulatory Visit: Payer: Self-pay

## 2019-08-20 ENCOUNTER — Emergency Department
Admission: EM | Admit: 2019-08-20 | Discharge: 2019-08-20 | Disposition: A | Discharge status: Home / Self Care | Payer: Medicaid Other | Attending: Emergency Medicine | Admitting: Emergency Medicine

## 2019-08-20 ENCOUNTER — Telehealth: Payer: Self-pay

## 2019-08-20 DIAGNOSIS — T783XXA Angioneurotic edema, initial encounter: Secondary | ICD-10-CM | POA: Insufficient documentation

## 2019-08-20 DIAGNOSIS — I10 Essential (primary) hypertension: Secondary | ICD-10-CM | POA: Diagnosis not present

## 2019-08-20 DIAGNOSIS — T433X5A Adverse effect of phenothiazine antipsychotics and neuroleptics, initial encounter: Secondary | ICD-10-CM | POA: Insufficient documentation

## 2019-08-20 DIAGNOSIS — T7840XA Allergy, unspecified, initial encounter: Secondary | ICD-10-CM

## 2019-08-20 DIAGNOSIS — Z87891 Personal history of nicotine dependence: Secondary | ICD-10-CM | POA: Insufficient documentation

## 2019-08-20 MED ORDER — PREDNISONE 20 MG PO TABS: 40.0000 mg | ORAL_TABLET | Freq: Every day | ORAL | 0 refills | Status: DC

## 2019-08-20 MED ORDER — PREDNISONE 20 MG PO TABS
40.0000 mg | ORAL_TABLET | Freq: Once | ORAL | Status: AC
  Administered 2019-08-20: 40 mg via ORAL
  Filled 2019-08-20: qty 2

## 2019-08-20 MED ORDER — EPINEPHRINE 0.3 MG/0.3ML IJ SOAJ: 0.3000 mg | Freq: Once | INTRAMUSCULAR | 0 refills | Status: AC

## 2019-08-20 NOTE — Telephone Encounter (Signed)
Female answered phone and stated patient was in a situation right now. I advised I am with [facility] and was returning her call/checking on her. She called 911 and EMS is w/her now about to transport to ER.

## 2019-08-20 NOTE — Telephone Encounter (Signed)
Pt seen in the ED , allergic reaction to Prochlorperazine, medication found in the stretcher after pt was discharged to home , called pt via phone to see what she would like to do , either come and pick up or have [place] dispose the medication, mediation disposed in medical waste liquid

## 2019-08-20 NOTE — ED Provider Notes (Signed)
[facility] Emergency Department Provider Note ____________________________________________   First MD Initiated Contact with Patient 08/20/19 1258     (approximate)  I have reviewed the triage vital signs and the nursing notes.   HISTORY  Chief Complaint Angioedema    HPI Janice Mccall is a 28 y.o. female presents for evaluation of sudden severe swelling of her tongue  Patient reports that she had just taken her first dose of prochlorperazine, medication she was prescribed for nausea during pregnancy.  Shortly after taking it she noticed her tongue swelled, she had difficulty swallowing, she felt like she was having allergic reaction.  She called EMS.  Paramedics provided epinephrine, Benadryl, Solu-Medrol.  Reported improvement.  Patient reports she feels much better.  All swelling and symptoms have improved.  No fevers.  No recent illness.  Pregnant, about 2 months now.  Denies any abdominal pain or bleeding.  She never used this medication before and the reaction started not long after taking the first dose of medicine this morning.  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 Authorizing Provider  EPINEPHrine 0.3 mg/0.3 mL IJ SOAJ injection Inject 0.3 mLs (0.3 mg total) into the muscle once for 1 dose. 08/20/19 08/20/19  Sharyn CreamerQuale, Bryson Gavia, MD  Prenatal Vit-Fe Fumarate-FA (MULTIVITAMIN-PRENATAL) 27-0.8 MG TABS tablet Take  1 tablet by mouth daily at 12 noon.    [provider]  prochlorperazine (COMPAZINE) 10 MG tablet Take 1 tablet (10 mg total) by mouth every 6 (six) hours as needed for nausea or vomiting. 08/17/19 08/20/19  Oswaldo ConroySchmid, Jacelyn Y, CNM    Allergies Prochlorperazine  No family history on file.  Social History Social History   Tobacco Use   Smoking status: Former Smoker    Packs/day: 0.50    Types: Cigarettes   Smokeless tobacco: Never Used  Substance Use Topics   Alcohol use: No   Drug use: Never    Review of Systems Constitutional: No fever/chills Eyes: No visual changes. ENT: See HPI Cardiovascular: Denies chest pain. Respiratory: Denies shortness of breath.  Difficulty breathing due to swelling in her mouth but that is improved. Gastrointestinal: No abdominal pain.   Genitourinary: Negative for dysuria. Musculoskeletal: Negative for back pain. Skin: Negative for rash. Neurological: Negative for areas of focal weakness or numbness.    ____________________________________________   PHYSICAL EXAM:  VITAL SIGNS: ED Triage Vitals [08/20/19 1251]  Enc Vitals Group     BP (!) 105/55     Pulse Rate 87     Resp 18     Temp 98.2 F (36.8 C)     Temp src      SpO2 99 %     Weight 241 lb (109.3 kg)     Height 5\' 4"  (1.626 m)     Head Circumference      Peak Flow      Pain Score 3     Pain Loc      Pain Edu?  Excl. in GC?     Constitutional: Alert and oriented. Well appearing and in no acute distress.  She is slightly somnolent, feeling sleepy after medications administered by EMS Eyes: Conjunctivae are normal. Head: Atraumatic.   Nose: No congestion/rhinnorhea. Mouth/Throat: Mucous membranes are moist.  Oropharynx is widely patent.  There is no angioedema of the tongue or the floor the mouth.  She reports she feels completely better now, but according to EMS and the patient was obviously much more swollen earlier.  Normal appearance now Neck: No  stridor.  Cardiovascular: Normal rate, regular rhythm. Grossly normal heart sounds.  Good peripheral circulation. Respiratory: Normal respiratory effort.  No retractions. Lungs CTAB. Gastrointestinal: Soft and nontender. No distention.  Likely gravid to the area of just above the pelvic brim. Musculoskeletal: No lower extremity tenderness nor edema. Neurologic:  Normal speech and language. No gross focal neurologic deficits are appreciated.  Skin:  Skin is warm, dry and intact. No rash noted. Psychiatric: Mood and affect are normal. Speech and behavior are normal.  ____________________________________________   LABS (all labs ordered are listed, but only abnormal results are displayed)  Labs Reviewed - No data to display ____________________________________________  EKG   ____________________________________________  RADIOLOGY   ____________________________________________   PROCEDURES  Procedure(s) performed: None  Procedures  Critical Care performed: No  ____________________________________________   INITIAL IMPRESSION / ASSESSMENT AND PLAN / ED COURSE  Pertinent labs & imaging results that were available during my care of the patient were reviewed by me and considered in my medical decision making (see chart for details).   Severe angioedema-like reaction.  This occurred in the setting of the use of a new medication I am fairly confident related to prochlorperazine which I have told her to discontinue when she understands this.  Instructed patient never to use this medication again which is agreeable with.  Very reassuring exam now, given the extent of angioedema and the use of epinephrine by EMS will observe her for a few hours.  She received Solu-Medrol, Benadryl, epinephrine by EMS.  Will prescribe epinephrine pen and steroids.  Patient is pregnant but at this point I suspect her use of steroid to inhibit further reaction clearly outweighs the risks of  medication  Clinical Course as of Aug 20 1355  Mon Aug 20, 2019  1318 Resting comfortably in no distress with no evidence of angioedema or ongoing allergic reaction at this time.   [MQ]    Clinical Course User Index [MQ] Sharyn Creamer, MD    Patient denies any pregnancy related complications.  Awake alert neurologically intact.  Slightly sleepy, seems secondary to likely use of Benadryl by EMS.   Patient observed, fully alert.  She is requesting discharge a little sooner than my preferred observation window, but her symptoms have not returned and she reports that she needs to do childcare and assist her husband who has to go to work.  She is awake alert fully oriented, appears appropriate for ongoing outpatient treatment.  Given prescription for steroid as well as EpiPen and I discussed use and learning around both of these including the EpiPen  Return precautions and treatment recommendations and follow-up discussed with the patient who is agreeable with the plan.  ____________________________________________   FINAL CLINICAL IMPRESSION(S) / ED DIAGNOSES  Final diagnoses:  Angioedema, initial encounter  Allergic reaction to drug, initial encounter        Note:  This document was prepared using Dragon voice recognition software and may include unintentional dictation errors  Delman Kitten, MD 08/23/19 [phone]

## 2019-08-20 NOTE — ED Notes (Signed)
Pt up to toilet 

## 2019-08-20 NOTE — Telephone Encounter (Signed)
Patient states she is having side effects from meds she was given on Friday. Her tongue is swollen and her eyes keep rolling in the back of her head. Inquiring if needs to call 911 or come to our office. [phone]

## 2019-08-20 NOTE — ED Notes (Signed)
Upon examination of pt, this RN does not notice any facial swelling and pt states that she does not feel swollen.

## 2019-08-20 NOTE — Discharge Instructions (Signed)
Discontinue use of prochlorperazine (Compazine), as I believe this caused a life-threatening allergic reaction and swelling for you today.  Never take this medication again  You have been seen in the Emergency Department (ED) today for an allergic reaction.  You have been stable throughout your stay in the Emergency Department.  Please take your medications as prescribed and follow up with your doctor as indicated.  You should also take over-the-counter Benadryl around the clock for the next three days according to the dosing instructions on the package.  Please keep your Epi-Pen with you at all times and use it if experience shortness of breath or difficulty breathing or if you believe you are having a severe allergic reaction.  If you use the Epi-Pen, though, please call 911 afterwards or go immediately to your nearest Emergency Department.  Return to the Emergency Department (ED) if you experience any worsening or new symptoms that concern you.

## 2019-08-20 NOTE — ED Triage Notes (Signed)
Pt comes via [facility] from home with c/o angioedema. EMS reports pt is 2 months pregnant and recently started new medication called Prochloroperazine.  Pt states she was fine and took medication this am. Pt states she then had an reaction and her tongue was swollen.  EMS reports upon arrival pt had angioedema. Pt was unable to swallow and drooling. EMS gave pt 50 ben, 125 solu-medrol,  1 Epi injection, 500 fluid bolus and 20 Famotidine.  Pt arrives alert and no angioedema present. pt able to swallow and no respiratory distress noted.  Pt c/o headache.

## 2019-08-27 ENCOUNTER — Other Ambulatory Visit: Payer: Self-pay

## 2019-08-27 ENCOUNTER — Ambulatory Visit
Admission: RE | Admit: 2019-08-27 | Discharge: 2019-08-27 | Disposition: A | Discharge status: Home / Self Care | Payer: Medicaid Other | Source: Ambulatory Visit | Attending: Obstetrics and Gynecology | Admitting: Obstetrics and Gynecology

## 2019-08-27 VITALS — BP 139/81 | HR 106 | Temp 97.2°F | Resp 18 | Ht 64.0 in | Wt 242.0 lb

## 2019-08-27 DIAGNOSIS — O99281 Endocrine, nutritional and metabolic diseases complicating pregnancy, first trimester: Secondary | ICD-10-CM

## 2019-08-27 DIAGNOSIS — R55 Syncope and collapse: Secondary | ICD-10-CM

## 2019-08-27 DIAGNOSIS — E079 Disorder of thyroid, unspecified: Secondary | ICD-10-CM | POA: Diagnosis not present

## 2019-08-27 DIAGNOSIS — O0990 Supervision of high risk pregnancy, unspecified, unspecified trimester: Secondary | ICD-10-CM

## 2019-08-27 HISTORY — DX: Disorder of thyroid, unspecified: E07.9

## 2019-08-27 HISTORY — DX: Gestational (pregnancy-induced) hypertension without significant proteinuria, unspecified trimester: O13.9

## 2019-08-27 NOTE — Consult Note (Signed)
[facility] Consultation   Chief Complaint: low TSH, elevated prolactin, Hgb F  HPI: [name] is a 28 y.o. G2P1001 at [redacted]w[redacted]d by LMP consistent with [redacted]w[redacted]d [place] who presents in consultation from [facility] for discussion and recommendation regarding multiple lab abnormalities.  What she complains about today, however, is unrelated to her lab abnormalities.  She c/o neck pain/occipital HA - this has been going on for a few weeks and appears to be relieved by heat and warm showers.  She has occasional blurry vision but then her vision clears with concentration.   She also c/o vomiting without nausea as well as multiple syncopal episodes while working at [employer].  These do not appear to be related to how long she has been standing, whether or not she has eaten and she reports she is conscientious about staying hydrated.  She does not experience loss of bowel or bladder after these episodes.  She reports that she feels shaky prior to blacking out.  She occasionally has periods of rapid HR.  She also reports crampy abdominal pain in the absence of constipation.  Past Medical History: Patient  has a past medical history of Hypertension affecting pregnancy, and Thyroid condition.  Past Surgical History: She  has a past surgical history that includes umbilical Hernia repair and thumb surgery.  Obstetric History:  OB History    Gravida  2   Para  1   Term  1   Preterm      AB      Living  1     SAB      TAB      Ectopic      Multiple      Live Births  23 December 2017 (same FOB):  Delivered at [redacted]w[redacted]d after IOL for preeclampsia without severe features at [facility]; readmitted to [facility] for preeclampsia and given mag sulfate.  D/ced home on verapamil and then switched to HCTZ but d/ced after script ran out at about 6 months PP.  Information in CareEverywhere.  Also had diagnosis of subclinical hyperthyroidisma at that time, elevated prolactin (with normal MRI  of pituitary) and HgbF.  Gynecologic History:  Patient's last menstrual period was 05/26/2019 (exact date).  Hx of abnormal pap smears: no Last pap smear approximate date 07/11/2017 and was normal   Medications: PNVs  Allergies: Patient is allergic to prochlorperazine.  Social History: Patient  reports that she has quit smoking. Her smoking use included cigarettes. She smoked 0.50 packs per day. She has never used smokeless tobacco. She reports that she does not drink alcohol or use drugs. She is married, feels safe at home and lives with her husband.  She works at [employer], her husband works at [employer]. Family History: family history includes Hypertension in her maternal grandmother and paternal grandmother.  Review of Systems A full 12 point review of systems was negative or as noted in the History of Present Illness.  Physical Exam: BP 139/81   Pulse (!) 106   Temp (!) 97.2 F (36.2 C)   Resp 18   Ht 5\' 4"  (1.626 m)   Wt 109.8 kg   LMP 05/26/2019 (Exact Date)   SpO2 98%   BMI 41.54 kg/m    Labs:   TSH 0.035 (0.45-4.5 uIU/mL) T4, total 10.7 (nl) T3 uptake ratio 17 (low) Free T4 1.8 (nl)  Prolactin 29.6 (4.8-23.3 ng/mL)  CBC    Component  Value Date/Time   WBC 7.2 08/15/2019 1336   RBC 3.96 08/15/2019 1336   HGB 12.1 08/15/2019 1336   HGB 12.0 08/03/2019 1002   HCT 36.2 08/15/2019 1336   HCT 35.8 08/03/2019 1002   PLT 172 08/15/2019 1336   PLT 166 08/03/2019 1002   MCV 91.4 08/15/2019 1336   MCV 93 08/03/2019 1002   MCH 30.6 08/15/2019 1336   MCHC 33.4 08/15/2019 1336   RDW 12.2 08/15/2019 1336   RDW 13.3 08/03/2019 1002   Hemoglobin electrophoresis consistent with persistent Hgb F  Asessement: 27yo G2P1 at [redacted]w[redacted]d 1.  Hyperprolactinemia 2.  Subclinical hyperthyroidism 3.  History of preeclampsia 4.  Syncopal episodes 5.  Neck pain/occipital HAs 6.  Persistent HgbF 7.  Vomiting 8.  Cramping abdominal pain 9.  Elevated BMI (41)  Plan: 1.   Hyperprolactinemia  Only mildly elevated prolactin level which is likely secondary to pregnancy (especially in the absence of symptoms, visual changes that persist and infertility/amennorhea).  Most recent MRI in March 2019 demonstrated normal appearing pituitary (no evidence of a prolactinoma).  Would not repeat or treat.  2.  Subclinical hyperthyroidism  Low TSH with normal free T4 in first trimester  Likely secondary to pregnancy   Repeat q trimester and would only treat if symptomatic or abnormalities in FT4 or T3   3.  History of preeclampsia  Normal baseline cbc and cmp  Recommend baseline p/c ratio  Recommend starting 81mg  ASA and continuing throughout pregnancy until delivery  Mild range BP today  4.  Syncopal episodes  Normal cbc and electrolytes  EKG ordered; consider 24 hr holter monitor/cardiology consult if persist  Recommend staying hydrated and moving feet frequently when standing for prolonged periods of time  5.  Neck pain/occipital HAs  Pain improved with heating pad and warm showers - likely musculoskeletal  Continue tylenol as needed, massage and heat  6.  Persistent HgbF  No evidence of co-hemoglobinopathy  Normal variant  7.  Vomiting without nausea  Encourage trying bland foods and frequent small snacks rather than regular large meals  Able to keep down fruit and water  8.  Abdominal crampy pain  Short lived - uncertain etiology  9.  Elevated BMI  Baseline preX labs  Start 81mg  ASA  Limit weight gain to <15#  Early glucola wnls  Recommend repeating TFTs in 4 weeks and f/up with MFM in 5 weeks to review lab findings and f/up on various symptoms.   Total time spent with the patient was 30 minutes with greater than 50% spent in counseling and coordination of care.  We appreciate this interesting consult and will be happy to be involved in the ongoing care of Janice Mccall in anyway her obstetricians desire.  Wynona Neat, MD [facility]

## 2019-08-28 DIAGNOSIS — R55 Syncope and collapse: Secondary | ICD-10-CM

## 2019-09-14 ENCOUNTER — Ambulatory Visit (INDEPENDENT_AMBULATORY_CARE_PROVIDER_SITE_OTHER): Payer: Medicaid Other | Admitting: Advanced Practice Midwife

## 2019-09-14 ENCOUNTER — Other Ambulatory Visit: Payer: Self-pay

## 2019-09-14 ENCOUNTER — Encounter: Payer: Self-pay | Admitting: Advanced Practice Midwife

## 2019-09-14 VITALS — BP 122/56 | Wt 246.0 lb

## 2019-09-14 DIAGNOSIS — Z3A15 15 weeks gestation of pregnancy: Secondary | ICD-10-CM

## 2019-09-14 DIAGNOSIS — O0990 Supervision of high risk pregnancy, unspecified, unspecified trimester: Secondary | ICD-10-CM

## 2019-09-14 DIAGNOSIS — O0992 Supervision of high risk pregnancy, unspecified, second trimester: Secondary | ICD-10-CM

## 2019-09-14 NOTE — Patient Instructions (Signed)

## 2019-09-14 NOTE — Progress Notes (Signed)
Routine Prenatal Care Visit  Subjective  Janice Mccall is a 28 y.o. G2P1001 at [redacted]w[redacted]d being seen today for ongoing prenatal care.  She is currently monitored for the following issues for this high-risk pregnancy and has Hyperprolactinemia ([place]); Subclinical hyperthyroidism; Supervision of high risk pregnancy, antepartum; Obesity affecting pregnancy, antepartum; BMI 40.0-44.9, adult ([place]); and Syncope on their problem list.  ----------------------------------------------------------------------------------- Patient reports constipation. She has tried increasing her water intake. She was unsure which medications were ok. Encouraged increased fiber and hydration. Reviewed safe medications.   Contractions: Not present. Vag. Bleeding: None.  Movement: Absent. Leaking Fluid denies.  ----------------------------------------------------------------------------------- The following portions of the patient's history were reviewed and updated as appropriate: allergies, current medications, past family history, past medical history, past social history, past surgical history and problem list. Problem list updated.  Objective  Blood pressure (!) 122/56, weight 246 lb (111.6 kg), last menstrual period 05/26/2019, not currently breastfeeding. Pregravid weight 244 lb (110.7 kg) Total Weight Gain 2 lb (0.907 kg) Urinalysis: Urine Protein    Urine Glucose    Fetal Status: Fetal Heart Rate (bpm): 152   Movement: Absent     General:  Alert, oriented and cooperative. Patient is in no acute distress.  Skin: Skin is warm and dry. No rash noted.   Cardiovascular: Normal heart rate noted  Respiratory: Normal respiratory effort, no problems with respiration noted  Abdomen: Soft, gravid, appropriate for gestational age. Pain/Pressure: Absent     Pelvic:  Cervical exam deferred        Extremities: Normal range of motion.     Mental Status: Normal mood and affect. Normal behavior. Normal judgment and thought  content.   Assessment   28 y.o. G2P1001 at [redacted]w[redacted]d by  03/01/2020, by Last Menstrual Period presenting for routine prenatal visit  Plan   pregnancy Problems (from 07/25/19 to present)    Problem Noted Resolved   Syncope 08/27/2019 by Wynona Neat, MD No   Overview Signed 08/27/2019 12:31 PM by Wynona Neat, MD    Reports multiple episodes of syncope while at work Will obtain EKG; normal cbc and electrolytes Consider 24hr holter monitor      Supervision of high risk pregnancy, antepartum 07/25/2019 by Rod Can, CNM No   Overview Addendum 08/17/2019 10:22 AM by Rexene Agent, [place] Prenatal Labs  Dating LMP = [redacted]w[redacted]d [place] Blood type: A/Positive/-- (10/09 1002)   Genetic Screen Declines Antibody:Negative (10/09 1002)  Anatomic [place]  Rubella: 16.60 (10/09 1002) Varicella: Immune  GTT Early:               Third trimester:  RPR: Non Reactive (10/09 1002)   Rhogam  HBsAg: Negative (10/09 1002)   TDaP vaccine                       Flu Shot: HIV: Non Reactive (10/09 1002)   Baby Food                                GBS:   Contraception  Pap:  CBB     CS/VBAC    Support Person                Preterm labor symptoms and general obstetric precautions including but not limited to vaginal bleeding, contractions, leaking of fluid and fetal movement were reviewed in detail with the patient. Please refer to After Visit Summary for  other counseling recommendations.   Return in about 4 weeks (around 10/12/2019) for anatomy scan and rob.  Tresea Mall, CNM 09/14/2019 11:09 AM

## 2019-09-14 NOTE — Progress Notes (Signed)
ROB constipation 

## 2019-09-19 ENCOUNTER — Telehealth: Payer: Self-pay

## 2019-09-19 NOTE — Telephone Encounter (Signed)
Pt calling; is having some serious shoulder pain.  What to do?  What to take?  [phone]  Pt has not fallen, has not hurt shoulder and no one has hurt her. Adv pt to try e.s. tylenol and apply heat/ice.  If that doesn't help needs to see her PCP.

## 2019-10-01 ENCOUNTER — Other Ambulatory Visit: Payer: Self-pay

## 2019-10-01 ENCOUNTER — Institutional Professional Consult (permissible substitution): Payer: Medicaid Other

## 2019-10-01 ENCOUNTER — Ambulatory Visit
Admission: RE | Admit: 2019-10-01 | Discharge: 2019-10-01 | Disposition: A | Discharge status: Home / Self Care | Payer: Medicaid Other | Source: Ambulatory Visit | Attending: Maternal & Fetal Medicine | Admitting: Maternal & Fetal Medicine

## 2019-10-01 DIAGNOSIS — R7989 Other specified abnormal findings of blood chemistry: Secondary | ICD-10-CM | POA: Diagnosis present

## 2019-10-01 DIAGNOSIS — O99892 Other specified diseases and conditions complicating childbirth: Secondary | ICD-10-CM | POA: Diagnosis not present

## 2019-10-01 DIAGNOSIS — O0990 Supervision of high risk pregnancy, unspecified, unspecified trimester: Secondary | ICD-10-CM

## 2019-10-01 DIAGNOSIS — Z888 Allergy status to other drugs, medicaments and biological substances status: Secondary | ICD-10-CM | POA: Insufficient documentation

## 2019-10-01 DIAGNOSIS — Z8249 Family history of ischemic heart disease and other diseases of the circulatory system: Secondary | ICD-10-CM | POA: Diagnosis not present

## 2019-10-01 DIAGNOSIS — R55 Syncope and collapse: Secondary | ICD-10-CM | POA: Diagnosis not present

## 2019-10-01 DIAGNOSIS — O26892 Other specified pregnancy related conditions, second trimester: Secondary | ICD-10-CM | POA: Insufficient documentation

## 2019-10-01 DIAGNOSIS — O99282 Endocrine, nutritional and metabolic diseases complicating pregnancy, second trimester: Secondary | ICD-10-CM | POA: Diagnosis not present

## 2019-10-01 DIAGNOSIS — Z3A18 18 weeks gestation of pregnancy: Secondary | ICD-10-CM | POA: Diagnosis not present

## 2019-10-01 DIAGNOSIS — E0590 Thyrotoxicosis, unspecified without thyrotoxic crisis or storm: Secondary | ICD-10-CM | POA: Diagnosis not present

## 2019-10-01 DIAGNOSIS — Z87891 Personal history of nicotine dependence: Secondary | ICD-10-CM | POA: Diagnosis not present

## 2019-10-01 DIAGNOSIS — M25519 Pain in unspecified shoulder: Secondary | ICD-10-CM | POA: Diagnosis not present

## 2019-10-01 NOTE — Progress Notes (Signed)
[place] Follow up Consultation     HPI: [name] is a 28 y.o. G2P1001 at [redacted]w[redacted]d by LMP consistent with [redacted]w[redacted]d [place] who presents in consultation from [place] for follow up  discussion and recommendation regarding multiple lab abnormalities.  She reports improvement in her nausea.  Notes SOB and tachycardia primarily when climbing stairs.  Heat intolerance when at work (?believes due to heater).  She does not c/o edema or tachycardia. Recent ECG was normal. She was noted to have subclinical hyperthyroidism in the first trimester.   She is still reporting presyncopal sxs, although reports they have decreased since her initial consult.   March 2019 (same FOB):  Delivered at [redacted]w[redacted]d after IOL for preeclampsia without severe features at [facility]; readmitted to [facility] for preeclampsia and given mag sulfate.  D/ced home on verapamil and then switched to HCTZ but d/ced after script ran out at about [facility] months PP.  Information in [place].  Also had diagnosis of subclinical hyperthyroidisma at that time, elevated prolactin (with normal MRI of pituitary) and HgbF.   She is not currently taking low dose aspirin.   Past Medical History: Patient  has a past medical history of Pregnancy induced hypertension and Thyroid condition.  Past Surgical History: She  has a past surgical history that includes Hernia repair.  Obstetric History:  OB History    Gravida  2   Para  1   Term  1   Preterm      AB      Living  1     SAB      TAB      Ectopic      Multiple      Live Births  1           Current Outpatient Medications on File Prior to Encounter  Medication Sig Dispense Refill  . Prenatal Vit-Fe Fumarate-FA (MULTIVITAMIN-PRENATAL) 27-0.8 MG TABS tablet Take 1 tablet by mouth daily at 12 noon.    . [DISCONTINUED] prochlorperazine (COMPAZINE) 10 MG tablet Take 1 tablet (10 mg total) by mouth every 6 (six) hours as needed for nausea or vomiting. 30 tablet 0    No current facility-administered medications on file prior to encounter.     Allergies: Patient is allergic to prochlorperazine.  Social History: Patient  reports that she has quit smoking. Her smoking use included cigarettes. She smoked 0.50 packs per day. She has never used smokeless tobacco. She reports that she does not drink alcohol or use drugs. She works as  Family History: family history includes Hypertension in her maternal grandmother and paternal grandmother.  Review of Systems A full 12 point review of systems was negative or as noted in the History of Present Illness.  Vitals:   10/01/19 0914  BP: 126/81  Pulse: (!) 101  Temp: 97.9 F (36.6 C)   FHR 140s RRR 2/6 SEM Lungs clear Sat 96% RA   Issues addressed today:  Subclinical hyperthyroidism             Low TSH with normal free T4 in first trimester             Likely secondary to pregnancy              Repeat q trimester and would only treat if symptomatic or abnormalities in FT4 or T3             Ordered today.   History of preeclampsia  Normal baseline cbc and cmp             Recommend baseline p/c ratio             Recommend starting 81mg  ASA and continuing throughout pregnancy until delivery--readdressed with patient and she plans to initiate             PreSyncopal episodes--given persistence, I would recommend referral to cardiology. She has cardiac risk factors (BMI, prior h/o preeclampsia)             Normal cbc and electrolytes             EKG ordered; consider 24 hr holter monitor/cardiology consult if persist             Recommend staying hydrated and moving feet frequently when standing for prolonged periods of time  Neck pain/occipital HAs--improved, but now with shoulder pain.works as .              likely musculoskeletal             previously recommended to take  tylenol as needed, massage and heat I would recommend referral to PT   Vomiting without  nausea--improved.              Encourage trying bland foods and frequent [place] snacks rather than regular large meals             Able to keep down fruit and water  Elevated BMI             Baseline preX labs             Start 81mg  ASA             Limit weight gain to <15#             Early glucola wnls  Serial fetal growth monthly   Total time spent with the patient was 30 minutes with greater than 50% spent in counseling and coordination of care. We appreciate this interesting consult and will be happy to be involved in the ongoing care of [name] in anyway her obstetricians desire.  , MD Maternal-Fetal Medicine [facility]

## 2019-10-02 LAB — THYROID PANEL WITH TSH
Free Thyroxine Index: 1 — ABNORMAL LOW (ref 1.2–4.9)
T3 Uptake Ratio: 10 % — ABNORMAL LOW (ref 24–39)
T4, Total: 10.2 ug/dL (ref 4.5–12.0)
TSH: 0.295 u[IU]/mL — ABNORMAL LOW (ref 0.450–4.500)

## 2019-10-12 ENCOUNTER — Encounter: Payer: Medicaid Other | Admitting: Advanced Practice Midwife

## 2019-10-12 ENCOUNTER — Other Ambulatory Visit: Payer: Medicaid Other

## 2019-10-15 ENCOUNTER — Other Ambulatory Visit: Payer: Self-pay

## 2019-10-15 ENCOUNTER — Emergency Department
Admission: EM | Admit: 2019-10-15 | Discharge: 2019-10-15 | Disposition: A | Discharge status: Home / Self Care | Payer: Medicaid Other | Attending: Emergency Medicine | Admitting: Emergency Medicine

## 2019-10-15 DIAGNOSIS — R1030 Lower abdominal pain, unspecified: Secondary | ICD-10-CM | POA: Diagnosis not present

## 2019-10-15 DIAGNOSIS — Z5320 Procedure and treatment not carried out because of patient's decision for unspecified reasons: Secondary | ICD-10-CM | POA: Diagnosis not present

## 2019-10-15 DIAGNOSIS — O26892 Other specified pregnancy related conditions, second trimester: Secondary | ICD-10-CM | POA: Diagnosis present

## 2019-10-15 LAB — COMPREHENSIVE METABOLIC PANEL
ALT: 19 U/L (ref 0–44)
AST: 22 U/L (ref 15–41)
Albumin: 3.3 g/dL — ABNORMAL LOW (ref 3.5–5.0)
Alkaline Phosphatase: 76 U/L (ref 38–126)
Anion gap: 10 (ref 5–15)
BUN: 9 mg/dL (ref 6–20)
CO2: 19 mmol/L — ABNORMAL LOW (ref 22–32)
Calcium: 8.9 mg/dL (ref 8.9–10.3)
Chloride: 105 mmol/L (ref 98–111)
Creatinine, Ser: 0.54 mg/dL (ref 0.44–1.00)
GFR calc Af Amer: >60 mL/min (ref >60)
GFR calc non Af Amer: >60 mL/min (ref >60)
Glucose, Bld: 82 mg/dL (ref 70–99)
Potassium: 3.9 mmol/L (ref 3.5–5.1)
Sodium: 134 mmol/L — ABNORMAL LOW (ref 135–145)
Total Bilirubin: 0.9 mg/dL (ref 0.3–1.2)
Total Protein: 7.2 g/dL (ref 6.5–8.1)

## 2019-10-15 LAB — HCG, QUANTITATIVE, PREGNANCY: hCG, Beta Chain, Quant, S: 13805 m[IU]/mL — ABNORMAL HIGH (ref <5)

## 2019-10-15 LAB — CBC
HCT: 36.4 % (ref 36.0–46.0)
Hemoglobin: 11.9 g/dL — ABNORMAL LOW (ref 12.0–15.0)
MCH: 31.2 pg (ref 26.0–34.0)
MCHC: 32.7 g/dL (ref 30.0–36.0)
MCV: 95.5 fL (ref 80.0–100.0)
Platelets: 167 10*3/uL (ref 150–400)
RBC: 3.81 MIL/uL — ABNORMAL LOW (ref 3.87–5.11)
RDW: 13.2 % (ref 11.5–15.5)
WBC: 8.7 10*3/uL (ref 4.0–10.5)
nRBC: 0 % (ref 0.0–0.2)

## 2019-10-15 MED ORDER — SODIUM CHLORIDE 0.9% FLUSH: 3.0000 mL | Freq: Once | INTRAVENOUS | Status: DC

## 2019-10-15 NOTE — ED Triage Notes (Signed)
Pt comes into the ED via EMS from work, with c/o lower abd pain , 18 weeks .. denies any bleeding or leaking fluid. G2P1,

## 2019-10-15 NOTE — ED Notes (Signed)
Pt called in the [facility] with no response 

## 2019-10-15 NOTE — ED Notes (Signed)
Pt returned empty urine come to front desk and noted walking out side.

## 2019-10-15 NOTE — ED Notes (Signed)
To ED via ems- EDD 03/01/20, making for 18w 4d. Pt reports lower abd pain.

## 2019-10-15 NOTE — ED Notes (Signed)
Pt called x1 in the [place] with no response

## 2019-10-23 ENCOUNTER — Observation Stay
Admission: EM | Admit: 2019-10-23 | Discharge: 2019-10-23 | Discharge status: Against Medical Advice | Payer: Medicaid Other | Attending: Obstetrics & Gynecology | Admitting: Obstetrics & Gynecology

## 2019-10-23 ENCOUNTER — Ambulatory Visit (INDEPENDENT_AMBULATORY_CARE_PROVIDER_SITE_OTHER): Payer: Medicaid Other

## 2019-10-23 ENCOUNTER — Ambulatory Visit (INDEPENDENT_AMBULATORY_CARE_PROVIDER_SITE_OTHER): Payer: Medicaid Other | Admitting: Certified Nurse Midwife

## 2019-10-23 ENCOUNTER — Encounter: Payer: Self-pay | Admitting: Obstetrics & Gynecology

## 2019-10-23 ENCOUNTER — Other Ambulatory Visit: Payer: Self-pay

## 2019-10-23 VITALS — BP 112/60 | Wt 249.0 lb

## 2019-10-23 DIAGNOSIS — O26892 Other specified pregnancy related conditions, second trimester: Secondary | ICD-10-CM | POA: Diagnosis present

## 2019-10-23 DIAGNOSIS — Z363 Encounter for antenatal screening for malformations: Secondary | ICD-10-CM

## 2019-10-23 DIAGNOSIS — R42 Dizziness and giddiness: Secondary | ICD-10-CM

## 2019-10-23 DIAGNOSIS — O99212 Obesity complicating pregnancy, second trimester: Secondary | ICD-10-CM | POA: Diagnosis not present

## 2019-10-23 DIAGNOSIS — Z3A21 21 weeks gestation of pregnancy: Secondary | ICD-10-CM

## 2019-10-23 DIAGNOSIS — R519 Headache, unspecified: Secondary | ICD-10-CM | POA: Diagnosis not present

## 2019-10-23 DIAGNOSIS — O0990 Supervision of high risk pregnancy, unspecified, unspecified trimester: Secondary | ICD-10-CM

## 2019-10-23 DIAGNOSIS — O0992 Supervision of high risk pregnancy, unspecified, second trimester: Secondary | ICD-10-CM

## 2019-10-23 LAB — URINALYSIS, ROUTINE W REFLEX MICROSCOPIC
Bilirubin Urine: NEGATIVE
Glucose, UA: NEGATIVE mg/dL
Hgb urine dipstick: NEGATIVE
Ketones, ur: NEGATIVE mg/dL
Nitrite: NEGATIVE
Protein, ur: NEGATIVE mg/dL
Specific Gravity, Urine: 1.019 (ref 1.005–1.030)
pH: 6 (ref 5.0–8.0)

## 2019-10-23 LAB — CBC
HCT: 31.6 % — ABNORMAL LOW (ref 36.0–46.0)
Hemoglobin: 10.8 g/dL — ABNORMAL LOW (ref 12.0–15.0)
MCH: 31.4 pg (ref 26.0–34.0)
MCHC: 34.2 g/dL (ref 30.0–36.0)
MCV: 91.9 fL (ref 80.0–100.0)
Platelets: 148 10*3/uL — ABNORMAL LOW (ref 150–400)
RBC: 3.44 MIL/uL — ABNORMAL LOW (ref 3.87–5.11)
RDW: 13.2 % (ref 11.5–15.5)
WBC: 7.9 10*3/uL (ref 4.0–10.5)
nRBC: 0 % (ref 0.0–0.2)

## 2019-10-23 LAB — URINE DRUG SCREEN, QUALITATIVE (ARMC ONLY)
Amphetamines, Ur Screen: NOT DETECTED
Barbiturates, Ur Screen: NOT DETECTED
Benzodiazepine, Ur Scrn: NOT DETECTED
Cannabinoid 50 Ng, Ur ~~LOC~~: NOT DETECTED
Cocaine Metabolite,Ur ~~LOC~~: NOT DETECTED
MDMA (Ecstasy)Ur Screen: NOT DETECTED
Methadone Scn, Ur: NOT DETECTED
Opiate, Ur Screen: NOT DETECTED
Phencyclidine (PCP) Ur S: NOT DETECTED
Tricyclic, Ur Screen: NOT DETECTED

## 2019-10-23 MED ORDER — LIDOCAINE HCL (PF) 1 % IJ SOLN: 30.0000 mL | INTRAMUSCULAR | Status: DC | PRN

## 2019-10-23 MED ORDER — ACETAMINOPHEN 325 MG PO TABS
650.0000 mg | ORAL_TABLET | ORAL | Status: DC | PRN
  Administered 2019-10-23: 650 mg via ORAL
  Filled 2019-10-23: qty 2

## 2019-10-23 MED ORDER — ONDANSETRON HCL 4 MG/2ML IJ SOLN: 4.0000 mg | Freq: Four times a day (QID) | INTRAMUSCULAR | Status: DC | PRN

## 2019-10-23 NOTE — Discharge Summary (Signed)
  See FPN 

## 2019-10-23 NOTE — Progress Notes (Signed)
Pt called out to inform RN that she had a urine sample ready. Pt verbalized to RN that she wanted to go home now. The RN explained to the patient that once her labs came back we would figure out what was going on with her and hopefully get her out of here soon. The patient then said I would like to be discharged now. I want to leave now. The RN explained to the patient that she could sign an AMA form and leave against medical advise. The patient then began to get dressed, signed the AMA form and left.

## 2019-10-23 NOTE — Final Progress Note (Signed)
Physician Final Progress Note  Patient ID: Janice Mccall MRN: [id number] DOB/AGE: 1991/02/22 28 y.o.  Admit date: 10/23/2019 Admitting provider: Gae Dry, MD Discharge date: 10/23/2019  Admission Diagnoses: Headache, Dizziness  Discharge Diagnoses:  Active Problems:   Dizzy   Headache   21 weeks pregnancy  Consults: None  Significant Findings/ Diagnostic Studies: Patient presented for evaluation of  Headache and dizziness. I reviewed her vital signs and fetal tracing, both of which were reassuring.  Patient was discharge as she was not laboring, and in fact she left AMA prior to her getting results of UA and UDS, that may have shed light on her sx's.  She reports feeling better and needing to leave.  Procedures:  Results for orders placed or performed during the hospital encounter of 10/23/19  Urinalysis, Routine w reflex microscopic  Result Value Ref Range   Color, Urine YELLOW (A) YELLOW   APPearance HAZY (A) CLEAR   Specific Gravity, Urine 1.019 1.005 - 1.030   pH 6.0 5.0 - 8.0   Glucose, UA NEGATIVE NEGATIVE mg/dL   Hgb urine dipstick NEGATIVE NEGATIVE   Bilirubin Urine NEGATIVE NEGATIVE   Ketones, ur NEGATIVE NEGATIVE mg/dL   Protein, ur NEGATIVE NEGATIVE mg/dL   Nitrite NEGATIVE NEGATIVE   Leukocytes,Ua SMALL (A) NEGATIVE   RBC / HPF 0-5 0 - 5 RBC/hpf   WBC, UA 6-10 0 - 5 WBC/hpf   Bacteria, UA FEW (A) NONE SEEN   Squamous Epithelial / LPF 6-10 0 - 5   Mucus PRESENT   CBC  Result Value Ref Range   WBC 7.9 4.0 - 10.5 K/uL   RBC 3.44 (L) 3.87 - 5.11 MIL/uL   Hemoglobin 10.8 (L) 12.0 - 15.0 g/dL   HCT 31.6 (L) 36.0 - 46.0 %   MCV 91.9 80.0 - 100.0 fL   MCH 31.4 26.0 - 34.0 pg   MCHC 34.2 30.0 - 36.0 g/dL   RDW 13.2 11.5 - 15.5 %   Platelets 148 (L) 150 - 400 K/uL   nRBC 0.0 0.0 - 0.2 %  Urine Drug Screen, Qualitative ([facility] only)  Result Value Ref Range   Tricyclic, Ur Screen NONE DETECTED NONE DETECTED   Amphetamines, Ur Screen NONE DETECTED NONE  DETECTED   MDMA (Ecstasy)Ur Screen NONE DETECTED NONE DETECTED   Cocaine Metabolite,Ur [place] NONE DETECTED NONE DETECTED   Opiate, Ur Screen NONE DETECTED NONE DETECTED   Phencyclidine (PCP) Ur S NONE DETECTED NONE DETECTED   Cannabinoid 50 Ng, Ur [place] NONE DETECTED NONE DETECTED   Barbiturates, Ur Screen NONE DETECTED NONE DETECTED   Benzodiazepine, Ur Scrn NONE DETECTED NONE DETECTED   Methadone Scn, Ur NONE DETECTED NONE DETECTED     Discharge Condition: good  Disposition: Discharge disposition: 01-Home or Self Care       Diet: Regular diet  Discharge Activity: Activity as tolerated   Allergies as of 10/23/2019      Reactions   Prochlorperazine Swelling      Medication List    TAKE these medications   acetaminophen 500 MG tablet Commonly known as: TYLENOL Take 500 mg by mouth every 6 (six) hours as needed.   multivitamin-prenatal 27-0.8 MG Tabs tablet Take 1 tablet by mouth daily at 12 noon.        Total time spent taking care of this patient: TRIAGE  Signed: Hoyt Koch 10/23/2019, 5:31 PM

## 2019-10-23 NOTE — OB Triage Note (Signed)
Pt presents c/o Headache for the past 2 days unrelieved by tylenol. Pt sensitive to light. Pt also reports some dizziness throughout the day and some abdominal pressure that seems to be worse at night. Pt denies bleeding or LOF. Reports positive fetal movement. BP slightly low with all other Vitals WNL. Will continue to monitor.

## 2019-10-23 NOTE — Progress Notes (Signed)
RN to pt room to encourage her to give [place] a urine. Pt denied being able to urinate. Pt given cup of water and advised to call out when she was able to give a urine. Pt. Verbalized understanding and rolled back over.

## 2019-10-26 NOTE — L&D Delivery Note (Addendum)
Delivery Note Primary OB: [facility] Delivery Physician: Annamarie Major, MD Gestational Age: Full term Antepartum complications: Polyhydramnios Intrapartum complications: None  PREOPERATIVE DIAGNOSES: 1. Term pregnancy at [redacted]wks Gestational Age 27. Maternal Exhaustion    POSTOPERATIVE DIAGNOSES: 1. Term pregnancy at [redacted]wks Gestational Age 27. Live, Viable female infant 3. Maternal Exhaustion    OPERATION PERFORMED: Vacuum-Assisted Vaginal Delivery   SURGEON: Annamarie Major, MD   ANESTHESIA: Epidural   ESTIMATED BLOOD LOSS: 100 cc   FINDINGS: Delivered a female infant with Apgars 7 and 8, three-vessel cord and normal placenta.   COMPLICATIONS: None   SITUATION:  The options for labor management at his time were discussed with the patient and her partner, as were the delivery options including a Kiwi vacuum delivery. The pros and cons and the risks of the vacuum delivery were discussed in detail, as were the alternative approaches. The patient and her partner decided to proceed with a Kiwi vacuum delivery.    DESCRIPTION OF THE PROCEDURE:    The baby's head was confirmed to be in the Straight OA presentation with 100% effacement and +1 station. The bladder was drained. The vacuum was placed and the correct placement in front of the posterior fontanelle was confirmed digitally. With the patient's next contraction, the vacuum was inflated and a gentle pressure was used to assist with maternal pushes to deliver the baby's head. In total there were 1 pulls and 0 pop-offs.  After delivery of the head, the vacuum was deflated and removed. There was a single nuchal cord.  The anterior  shoulder was delivered with gentle downward guidance followed by delivery of the posterior  shoulder with gentle upward guidance. The infant was placed on the maternal chest.  The cord was clamped x2 and cut. The infant was handed to the NICU attendants.   Pitocin was added to the patient's IV fluids. The placenta delivered  spontaneously, was intact and had a three-vessel cord. A vaginal inspection revealed no perineal lacetation.   At the end of the delivery mom and baby were recovering in stable condition.  Sponge, instrument, and needle counts were correct times two.   Annamarie Major, MD, [facility], [facility] 02/12/2020  5:23 PM [phone]

## 2019-10-27 NOTE — Progress Notes (Signed)
ROB and anatomy scan at 21wk3d. Patient presents with complaints of a 9 out of 10 frontal headache x 3 days. Tylenol helped a little initially. Her headache is accompanied by lightheadedness at times and vertigo at other times.BP 112/60 Denies cough, fever, congestion. Feeling baby moving Anatomy scan : CGA 21wk6d: normal anatomy with anterior placenta and female genitalia  Exam: General: appears weak, quiet, answering questions appropriately Neuro exam: CN III-XII intact Difficulty doing finger to nose Appears to be a little unsteady when first arising to walk, but did nor need support  A: Headache 9/10 with lightheadedness/ vertigo at times  P: Recommend going to the ER for further evaluation and treatment f her headache.  Farrel Conners, CNM

## 2019-10-31 ENCOUNTER — Telehealth: Payer: Self-pay | Admitting: Certified Nurse Midwife

## 2019-10-31 NOTE — Telephone Encounter (Signed)
Message left for patient to cb and schedule.  

## 2019-10-31 NOTE — Telephone Encounter (Signed)
-----   Message from Farrel Conners, [place] sent at 10/27/2019  1:19 PM EST ----- Regarding: schedule ROB Please schedule for another ROB in 1 week. Thanks, [employer]

## 2019-10-31 NOTE — Telephone Encounter (Signed)
Patient is schedule 11/08/19 with SDJ

## 2019-11-08 ENCOUNTER — Encounter: Payer: Self-pay | Admitting: Obstetrics and Gynecology

## 2019-11-08 ENCOUNTER — Ambulatory Visit (INDEPENDENT_AMBULATORY_CARE_PROVIDER_SITE_OTHER): Payer: Medicaid Other | Admitting: Obstetrics and Gynecology

## 2019-11-08 ENCOUNTER — Other Ambulatory Visit: Payer: Self-pay

## 2019-11-08 VITALS — BP 119/68 | Wt 253.0 lb

## 2019-11-08 DIAGNOSIS — O99210 Obesity complicating pregnancy, unspecified trimester: Secondary | ICD-10-CM

## 2019-11-08 DIAGNOSIS — O0992 Supervision of high risk pregnancy, unspecified, second trimester: Secondary | ICD-10-CM

## 2019-11-08 DIAGNOSIS — E0590 Thyrotoxicosis, unspecified without thyrotoxic crisis or storm: Secondary | ICD-10-CM

## 2019-11-08 DIAGNOSIS — Z6841 Body Mass Index (BMI) 40.0 and over, adult: Secondary | ICD-10-CM

## 2019-11-08 DIAGNOSIS — Z3A23 23 weeks gestation of pregnancy: Secondary | ICD-10-CM

## 2019-11-08 DIAGNOSIS — O99212 Obesity complicating pregnancy, second trimester: Secondary | ICD-10-CM

## 2019-11-08 DIAGNOSIS — O0990 Supervision of high risk pregnancy, unspecified, unspecified trimester: Secondary | ICD-10-CM

## 2019-11-08 NOTE — Progress Notes (Signed)
Routine Prenatal Care Visit  Subjective  Janice Mccall is a 29 y.o. G2P1001 at [redacted]w[redacted]d being seen today for ongoing prenatal care.  She is currently monitored for the following issues for this high-risk pregnancy and has Hyperprolactinemia (HCC); Subclinical hyperthyroidism; Supervision of high risk pregnancy, antepartum; Obesity affecting pregnancy, antepartum; BMI 40.0-44.9, adult (HCC); Syncope; and Dizzy on their problem list.  ----------------------------------------------------------------------------------- Patient reports no complaints.   Contractions: Not present. Vag. Bleeding: None.  Movement: Present. Leaking Fluid denies.  ----------------------------------------------------------------------------------- The following portions of the patient's history were reviewed and updated as appropriate: allergies, current medications, past family history, past medical history, past social history, past surgical history and problem list. Problem list updated.  Objective  Blood pressure 119/68, weight 253 lb (114.8 kg), last menstrual period 05/26/2019, not currently breastfeeding. Pregravid weight 244 lb (110.7 kg) Total Weight Gain 9 lb (4.082 kg) Urinalysis: Urine Protein    Urine Glucose    Fetal Status: Fetal Heart Rate (bpm): 150   Movement: Present     General:  Alert, oriented and cooperative. Patient is in no acute distress.  Skin: Skin is warm and dry. No rash noted.   Cardiovascular: Normal heart rate noted  Respiratory: Normal respiratory effort, no problems with respiration noted  Abdomen: Soft, gravid, appropriate for gestational age. Pain/Pressure: Absent     Pelvic:  Cervical exam deferred        Extremities: Normal range of motion.     Mental Status: Normal mood and affect. Normal behavior. Normal judgment and thought content.   Assessment   29 y.o. G2P1001 at [redacted]w[redacted]d by  03/01/2020, by Last Menstrual Period presenting for routine prenatal visit  Plan   pregnancy  Problems (from 07/25/19 to present)    Problem Noted Resolved   Syncope 08/27/2019 by Kirby Funk, MD No   Overview Signed 08/27/2019 12:31 PM by Kirby Funk, MD    Reports multiple episodes of syncope while at work Will obtain EKG; normal cbc and electrolytes Consider 24hr holter monitor      Supervision of high risk pregnancy, antepartum 07/25/2019 by Tresea Mall, CNM No   Overview Addendum 10/27/2019  1:09 PM by Farrel Conners, CNM    Clinic [facility] Prenatal Labs  Dating LMP = [redacted]w[redacted]d [place] Blood type: A/Positive/-- (10/09 1002)   Genetic Screen Declines Antibody:Negative (10/09 1002)  Anatomic [place] Normal anatomy, ant placenta, female gender Rubella: 16.60 (10/09 1002) Varicella: Immune  GTT Early:  105             Third trimester:  RPR: Non Reactive (10/09 1002)   Rhogam  HBsAg: Negative (10/09 1002)   TDaP vaccine                       Flu Shot: HIV: Non Reactive (10/09 1002)   Baby Food                                GBS:   Contraception  Pap:  CBB     CS/VBAC    Support Person                Preterm labor symptoms and general obstetric precautions including but not limited to vaginal bleeding, contractions, leaking of fluid and fetal movement were reviewed in detail with the patient. Please refer to After Visit Summary for other counseling recommendations.   Return in about 4 weeks (around 12/06/2019) for Ultrasound growth/AFI, 28  week labs, routine prenatal.  Prentice Docker, MD, [place], [place] [facility] 11/08/2019 12:32 PM

## 2019-12-05 ENCOUNTER — Telehealth: Payer: Self-pay

## 2019-12-05 ENCOUNTER — Other Ambulatory Visit: Payer: Self-pay

## 2019-12-05 ENCOUNTER — Observation Stay
Admission: EM | Admit: 2019-12-05 | Discharge: 2019-12-05 | Disposition: A | Discharge status: Home / Self Care | Payer: Medicaid Other | Attending: Certified Nurse Midwife | Admitting: Certified Nurse Midwife

## 2019-12-05 ENCOUNTER — Encounter: Payer: Self-pay | Admitting: Obstetrics and Gynecology

## 2019-12-05 DIAGNOSIS — O23592 Infection of other part of genital tract in pregnancy, second trimester: Secondary | ICD-10-CM | POA: Diagnosis not present

## 2019-12-05 DIAGNOSIS — R8271 Bacteriuria: Secondary | ICD-10-CM

## 2019-12-05 DIAGNOSIS — O26892 Other specified pregnancy related conditions, second trimester: Secondary | ICD-10-CM | POA: Diagnosis present

## 2019-12-05 DIAGNOSIS — O26893 Other specified pregnancy related conditions, third trimester: Secondary | ICD-10-CM | POA: Diagnosis present

## 2019-12-05 DIAGNOSIS — R102 Pelvic and perineal pain: Secondary | ICD-10-CM | POA: Insufficient documentation

## 2019-12-05 DIAGNOSIS — Z87891 Personal history of nicotine dependence: Secondary | ICD-10-CM | POA: Diagnosis not present

## 2019-12-05 DIAGNOSIS — Z3A27 27 weeks gestation of pregnancy: Secondary | ICD-10-CM | POA: Diagnosis not present

## 2019-12-05 DIAGNOSIS — B373 Candidiasis of vulva and vagina: Secondary | ICD-10-CM | POA: Insufficient documentation

## 2019-12-05 DIAGNOSIS — O99212 Obesity complicating pregnancy, second trimester: Secondary | ICD-10-CM | POA: Insufficient documentation

## 2019-12-05 DIAGNOSIS — N949 Unspecified condition associated with female genital organs and menstrual cycle: Secondary | ICD-10-CM | POA: Diagnosis present

## 2019-12-05 DIAGNOSIS — O322XX0 Maternal care for transverse and oblique lie, not applicable or unspecified: Secondary | ICD-10-CM | POA: Insufficient documentation

## 2019-12-05 DIAGNOSIS — R109 Unspecified abdominal pain: Secondary | ICD-10-CM | POA: Diagnosis present

## 2019-12-05 DIAGNOSIS — O0990 Supervision of high risk pregnancy, unspecified, unspecified trimester: Secondary | ICD-10-CM

## 2019-12-05 HISTORY — DX: Obesity, unspecified: E66.9

## 2019-12-05 LAB — URINALYSIS, COMPLETE (UACMP) WITH MICROSCOPIC
Bilirubin Urine: NEGATIVE
Glucose, UA: NEGATIVE mg/dL
Hgb urine dipstick: NEGATIVE
Ketones, ur: NEGATIVE mg/dL
Nitrite: NEGATIVE
Protein, ur: NEGATIVE mg/dL
Specific Gravity, Urine: 1.015 (ref 1.005–1.030)
pH: 7 (ref 5.0–8.0)

## 2019-12-05 LAB — CBC
HCT: 35.3 % — ABNORMAL LOW (ref 36.0–46.0)
Hemoglobin: 11.6 g/dL — ABNORMAL LOW (ref 12.0–15.0)
MCH: 31 pg (ref 26.0–34.0)
MCHC: 32.9 g/dL (ref 30.0–36.0)
MCV: 94.4 fL (ref 80.0–100.0)
Platelets: 189 10*3/uL (ref 150–400)
RBC: 3.74 MIL/uL — ABNORMAL LOW (ref 3.87–5.11)
RDW: 13.2 % (ref 11.5–15.5)
WBC: 8.8 10*3/uL (ref 4.0–10.5)
nRBC: 0 % (ref 0.0–0.2)

## 2019-12-05 LAB — CHLAMYDIA/NGC RT PCR (ARMC ONLY)
Chlamydia Tr: NOT DETECTED
N gonorrhoeae: NOT DETECTED

## 2019-12-05 LAB — FETAL FIBRONECTIN: Fetal Fibronectin: NEGATIVE

## 2019-12-05 LAB — GROUP B STREP BY PCR: Group B strep by PCR: NEGATIVE

## 2019-12-05 MED ORDER — CEFAZOLIN SODIUM-DEXTROSE 2-4 GM/100ML-% IV SOLN
2.0000 g | Freq: Once | INTRAVENOUS | Status: AC
  Administered 2019-12-05: 15:00:00 2 g via INTRAVENOUS
  Filled 2019-12-05: qty 100

## 2019-12-05 MED ORDER — CEPHALEXIN 500 MG PO CAPS: 500.0000 mg | ORAL_CAPSULE | Freq: Three times a day (TID) | ORAL | 0 refills | Status: AC

## 2019-12-05 MED ORDER — LACTATED RINGERS IV SOLN: INTRAVENOUS | Status: DC

## 2019-12-05 MED ORDER — ONDANSETRON HCL 4 MG/2ML IJ SOLN: 4.0000 mg | Freq: Four times a day (QID) | INTRAMUSCULAR | Status: DC | PRN

## 2019-12-05 MED ORDER — TERCONAZOLE 80 MG VA SUPP: VAGINAL | 0 refills | Status: DC

## 2019-12-05 MED ORDER — LACTATED RINGERS IV SOLN: 500.0000 mL | INTRAVENOUS | Status: DC | PRN

## 2019-12-05 NOTE — Telephone Encounter (Signed)
Pt calling c/o feeling a lot of pressure for 2d now in bottom of her stomach.  Should she go to the hosp?  Has appt on the 12th but pressure is really bad.  [phone]  Pt states she is hard and tight all over and in to her back.  Adv to go to L&D via ED.  [place] notified.

## 2019-12-05 NOTE — OB Triage Note (Signed)
Pt is a 28yo G2P1 at [redacted]w[redacted]d that presents from the ED with c/o of pelvic pressure and back pain. Patient arrived on the unit via wheelchair and was visibly in pain with tears.RN obtained an FFN then Brittney N. RN checked patients cervix and she was 1.5cm and posterior. Pt states the pain started last night around 8pm. Last BM was Monday. Pt denies VB, LOF and states positive FM. Monitors applied and initial FHT 145. Will continue to monitor.

## 2019-12-05 NOTE — OB Triage Note (Signed)
Discharge instructions reviewed with patient. Pt instructed to pick up medications today and follow up at her scheduled OB appointment on 12/07/19. Pt has no further questions at this time.  Pt verbalized understanding of discharge instructions and D/C home in stable condition.

## 2019-12-05 NOTE — Final Progress Note (Signed)
Physician Final Progress Note  Patient ID: Janice Mccall MRN: [id number] DOB/AGE: Apr 13, 1991 29 y.o.  Admit date: 12/05/2019 Admitting provider: Conard Novak, MD/ Gasper Lloyd. Sharen Hones, CNM Discharge date: 12/09/2019   Admission Diagnoses: Pelvic pressure in pregnancy at 27 weeks 4 days  Discharge Diagnoses: IUP at 27wk4d gestation Transverse presentation Probable UTI  Consults: None  Significant Findings/ Diagnostic Studies: Janice Mccall is a 29 year old G2 P1001 with EDC=03/01/2020 who presents with pelvic pressure and back pain since 8 PM last night. She arrived on the unit via wheelchair and was visibly in pain and in tears. The RN evaluated the patient initially as the midwife was in the OR assisting with a Cesarean section. A FFN was obtained and then the cervix was checked and was 1+/ thick and posterior. The pain was constant and patient denied contractions. + FM. No vaginal bleeding or leakage of water. Denies dysuria Prenatal record as well as past medical, obstetric, surgical , social histories were reviewed.  Prenatal care is at [facility] and has been remarkable for obesity with current BMI 44 kg/m2, subclinical hyperthyroidism, syncopal episodes, headache in first trimester, and  A history of preeclampsia with her first pregnancy. Her pregnancy is also remarkable for the following:  Clinic [facility] Prenatal Labs  Dating LMP = [redacted]w[redacted]d [place] Blood type: A/Positive/-- (10/09 1002)   Genetic Screen Declines Antibody:Negative (10/09 1002)  Anatomic [place] Normal anatomy, ant placenta, female gender Rubella: 16.60 (10/09 1002) Varicella: Immune  GTT Early:  105             Third trimester:  RPR: Non Reactive (10/09 1002)   Rhogam n/a HBsAg: Negative (10/09 1002)   TDaP vaccine      2019   Flu Shot: HIV: Non Reactive (10/09 1002)   Baby Food     Breast              GBS:   Contraception     BTL, needs consent [ ]  Pap:  CBB  No   CS/VBAC n/a   Support Person     Past Medical History:   Diagnosis Date  . Obesity   . Pregnancy induced hypertension   . Thyroid condition    OB History  Gravida Para Term Preterm AB Living  2 1 1     1   SAB TAB Ectopic Multiple Live Births          1    # Outcome Date GA Lbr Len/2nd Weight Sex Delivery Anes PTL Lv  2 Current           1 Term 12/30/17 [redacted]w[redacted]d  2785 g F Vag-Spont   LIV     Complications: Preeclampsia   Past Surgical History:  Procedure Laterality Date  . HERNIA REPAIR     Family History  Problem Relation Age of Onset  . Hypertension Maternal Grandmother   . Hypertension Paternal Grandmother    Social History   Socioeconomic History  . Marital status: Married    Spouse name: Parys Elenbaas  . Number of children: Not on file  . Years of education: Not on file  . Highest education level: Not on file  Occupational History  . Not on file  Tobacco Use  . Smoking status: Former Smoker    Packs/day: 0.50    Types: Cigarettes  . Smokeless tobacco: Never Used  Substance and Sexual Activity  . Alcohol use: No  . Drug use: Never  . Sexual activity: Yes    Birth  control/protection: Surgical  Other Topics Concern  . Not on file  Social History Narrative  . Not on file   Social Determinants of Health   Exam: General: At the time of the exam by the CNM, patient appeared more comfortable and had stopped crying. BP 130/62 (BP Location: Right Arm)   Pulse 94   Temp 98.6 F (37 C) (Oral)   Resp 17   Ht 5\' 4"  (1.626 m)   Wt 107 kg   LMP 05/26/2019 (Exact Date)   BMI 40.51 kg/m  Respiratory: normal respiratory effort, CTAB Heart: RRR Back: no CVAT Abdomen: gravid, NT Ultrasound: fetus in transverse lie with fetal head on the maternal left Good FM FHR: 140s to 150 with accelerations to 160s, moderate variability Toco: 2 contractions seen in 2.5 hours of monitoring  Pelvic exam: External/BUS: white curdlike discharge at introitus, tender introitus Vagina: tender, unable to insert speculum due to patient's  pain Wet prep: positive for hyphae and negative for Trich and clue cells  Extremities: no edema, no tenderness or redness  Urinalysis: Urinalysis    Component Value Date/Time   COLORURINE YELLOW (A) 12/05/2019 1341   APPEARANCEUR HAZY (A) 12/05/2019 1341   LABSPEC 1.015 12/05/2019 1341   PHURINE 7.0 12/05/2019 1341   GLUCOSEU NEGATIVE 12/05/2019 1341   HGBUR NEGATIVE 12/05/2019 1341   BILIRUBINUR NEGATIVE 12/05/2019 1341   KETONESUR NEGATIVE 12/05/2019 1341   PROTEINUR NEGATIVE 12/05/2019 1341   NITRITE NEGATIVE 12/05/2019 1341   LEUKOCYTESUR SMALL (A) 12/05/2019 1341   With 0-5 RBC, 6-10 WBC, many bacteria, and 0-5 squamous cells  Fetal fibronectin: negative  CBC: WBC: 8.8 WBC, H&H 11.6/35.3  Gonorrhea and Chlamydia are negative  A: IUP at 27wk4days with sudden onset of pelvic pressure/ pain Maybe related to fetal position and UTI Monilial vulvovaginitis Preterm dilation, but no evidence of preterm labor   Negative FFN  P: IV Ancef 2 GMs given as IV had been started in order to hydrate if she was in labor. IV discontinued. RX for Keflex 500 mgm tid x 7 days Urine culture pending Terazol suppositories every other night x 3 doses Follow up as scheduled at [facility] 12/07/2019 Preterm labor precautions   Discharge Condition: stable  Disposition:  Discharge disposition: 01-Home or Self Care       Diet: Regular diet  Discharge Activity: Activity as tolerated  Discharge Instructions    Discharge patient   Complete by: As directed    Discharge disposition: 01-Home or Self Care   Discharge patient date: 12/05/2019     Allergies as of 12/05/2019      Reactions   Prochlorperazine Swelling      Medication List    TAKE these medications   acetaminophen 500 MG tablet Commonly known as: TYLENOL Take 500 mg by mouth every 6 (six) hours as needed.   cephALEXin 500 MG capsule Commonly known as: KEFLEX Take 1 capsule (500 mg total) by mouth 3 (three) times daily  for 7 days.   multivitamin-prenatal 27-0.8 MG Tabs tablet Take 1 tablet by mouth daily at 12 noon.   terconazole 80 MG vaginal suppository Commonly known as: TERAZOL 3 Insert one suppository PV every other night x 3 doses      Follow-up Information    02/02/2020, MD. Go on 12/07/2019.   Specialty: Obstetrics and Gynecology Why: as scheduled at [facility] information: [address] [place] [phone]           Total time spent  taking care of this patient: 30 minutes  Signed: Dalia Heading 12/09/2019, 4:10 PM

## 2019-12-05 NOTE — Discharge Instructions (Signed)

## 2019-12-07 ENCOUNTER — Ambulatory Visit (INDEPENDENT_AMBULATORY_CARE_PROVIDER_SITE_OTHER): Payer: Medicaid Other | Admitting: Obstetrics & Gynecology

## 2019-12-07 ENCOUNTER — Other Ambulatory Visit: Payer: Self-pay | Admitting: Obstetrics and Gynecology

## 2019-12-07 ENCOUNTER — Other Ambulatory Visit: Payer: Medicaid Other

## 2019-12-07 ENCOUNTER — Other Ambulatory Visit: Payer: Self-pay

## 2019-12-07 ENCOUNTER — Encounter: Payer: Self-pay | Admitting: Obstetrics & Gynecology

## 2019-12-07 ENCOUNTER — Ambulatory Visit (INDEPENDENT_AMBULATORY_CARE_PROVIDER_SITE_OTHER): Payer: Medicaid Other

## 2019-12-07 VITALS — BP 120/80 | Wt 257.0 lb

## 2019-12-07 DIAGNOSIS — O99210 Obesity complicating pregnancy, unspecified trimester: Secondary | ICD-10-CM | POA: Diagnosis not present

## 2019-12-07 DIAGNOSIS — O321XX0 Maternal care for breech presentation, not applicable or unspecified: Secondary | ICD-10-CM | POA: Diagnosis not present

## 2019-12-07 DIAGNOSIS — O0990 Supervision of high risk pregnancy, unspecified, unspecified trimester: Secondary | ICD-10-CM

## 2019-12-07 DIAGNOSIS — Z3A27 27 weeks gestation of pregnancy: Secondary | ICD-10-CM

## 2019-12-07 DIAGNOSIS — Z3A28 28 weeks gestation of pregnancy: Secondary | ICD-10-CM | POA: Diagnosis not present

## 2019-12-07 DIAGNOSIS — O99213 Obesity complicating pregnancy, third trimester: Secondary | ICD-10-CM

## 2019-12-07 DIAGNOSIS — Z6841 Body Mass Index (BMI) 40.0 and over, adult: Secondary | ICD-10-CM

## 2019-12-07 DIAGNOSIS — O0993 Supervision of high risk pregnancy, unspecified, third trimester: Secondary | ICD-10-CM

## 2019-12-07 LAB — URINE CULTURE
Culture: 80000 — AB
Special Requests: NORMAL

## 2019-12-07 NOTE — Patient Instructions (Signed)
Third Trimester of Pregnancy The third trimester is from week 28 through week 40 (months 7 through 9). The third trimester is a time when the unborn baby (fetus) is growing rapidly. At the end of the ninth month, the fetus is about 20 inches in length and weighs 6-10 pounds. Body changes during your third trimester Your body will continue to go through many changes during pregnancy. The changes vary from woman to woman. During the third trimester:  Your weight will continue to increase. You can expect to gain 25-35 pounds (11-16 kg) by the end of the pregnancy.  You may begin to get stretch marks on your hips, abdomen, and breasts.  You may urinate more often because the fetus is moving lower into your pelvis and pressing on your bladder.  You may develop or continue to have heartburn. This is caused by increased hormones that slow down muscles in the digestive tract.  You may develop or continue to have constipation because increased hormones slow digestion and cause the muscles that push waste through your intestines to relax.  You may develop hemorrhoids. These are swollen veins (varicose veins) in the rectum that can itch or be painful.  You may develop swollen, bulging veins (varicose veins) in your legs.  You may have increased body aches in the pelvis, back, or thighs. This is due to weight gain and increased hormones that are relaxing your joints.  You may have changes in your hair. These can include thickening of your hair, rapid growth, and changes in texture. Some women also have hair loss during or after pregnancy, or hair that feels dry or thin. Your hair will most likely return to normal after your baby is born.  Your breasts will continue to grow and they will continue to become tender. A yellow fluid (colostrum) may leak from your breasts. This is the first milk you are producing for your baby.  Your belly button may stick out.  You may notice more swelling in your hands,  face, or ankles.  You may have increased tingling or numbness in your hands, arms, and legs. The skin on your belly may also feel numb.  You may feel short of breath because of your expanding uterus.  You may have more problems sleeping. This can be caused by the size of your belly, increased need to urinate, and an increase in your body's metabolism.  You may notice the fetus "dropping," or moving lower in your abdomen (lightening).  You may have increased vaginal discharge.  You may notice your joints feel loose and you may have pain around your pelvic bone. What to expect at prenatal visits You will have prenatal exams every 2 weeks until week 36. Then you will have weekly prenatal exams. During a routine prenatal visit:  You will be weighed to make sure you and the baby are growing normally.  Your blood pressure will be taken.  Your abdomen will be measured to track your baby's growth.  The fetal heartbeat will be listened to.  Any test results from the previous visit will be discussed.  You may have a cervical check near your due date to see if your cervix has softened or thinned (effaced).  You will be tested for Group B streptococcus. This happens between 35 and 37 weeks. Your health care provider may ask you:  What your birth plan is.  How you are feeling.  If you are feeling the baby move.  If you have had any abnormal   symptoms, such as leaking fluid, bleeding, severe headaches, or abdominal cramping.  If you are using any tobacco products, including cigarettes, chewing tobacco, and electronic cigarettes.  If you have any questions. Other tests or screenings that may be performed during your third trimester include:  Blood tests that check for low iron levels (anemia).  Fetal testing to check the health, activity level, and growth of the fetus. Testing is done if you have certain medical conditions or if there are problems during the pregnancy.  Nonstress test  (NST). This test checks the health of your baby to make sure there are no signs of problems, such as the baby not getting enough oxygen. During this test, a belt is placed around your belly. The baby is made to move, and its heart rate is monitored during movement. What is false labor? False labor is a condition in which you feel small, irregular tightenings of the muscles in the womb (contractions) that usually go away with rest, changing position, or drinking water. These are called Braxton Hicks contractions. Contractions may last for hours, days, or even weeks before true labor sets in. If contractions come at regular intervals, become more frequent, increase in intensity, or become painful, you should see your health care provider. What are the signs of labor?  Abdominal cramps.  Regular contractions that start at 10 minutes apart and become stronger and more frequent with time.  Contractions that start on the top of the uterus and spread down to the lower abdomen and back.  Increased pelvic pressure and dull back pain.  A watery or bloody mucus discharge that comes from the vagina.  Leaking of amniotic fluid. This is also known as your "water breaking." It could be a slow trickle or a gush. Let your health care provider know if it has a color or strange odor. If you have any of these signs, call your health care provider right away, even if it is before your due date. Follow these instructions at home: Medicines  Follow your health care provider's instructions regarding medicine use. Specific medicines may be either safe or unsafe to take during pregnancy.  Take a prenatal vitamin that contains at least 600 micrograms (mcg) of folic acid.  If you develop constipation, try taking a stool softener if your health care provider approves. Eating and drinking   Eat a balanced diet that includes fresh fruits and vegetables, whole grains, good sources of protein such as meat, eggs, or tofu,  and low-fat dairy. Your health care provider will help you determine the amount of weight gain that is right for you.  Avoid raw meat and uncooked cheese. These carry germs that can cause birth defects in the baby.  If you have low calcium intake from food, talk to your health care provider about whether you should take a daily calcium supplement.  Eat four or five small meals rather than three large meals a day.  Limit foods that are high in fat and processed sugars, such as fried and sweet foods.  To prevent constipation: ? Drink enough fluid to keep your urine clear or pale yellow. ? Eat foods that are high in fiber, such as fresh fruits and vegetables, whole grains, and beans. Activity  Exercise only as directed by your health care provider. Most women can continue their usual exercise routine during pregnancy. Try to exercise for 30 minutes at least 5 days a week. Stop exercising if you experience uterine contractions.  Avoid heavy lifting.  Do   not exercise in extreme heat or humidity, or at high altitudes.  Wear low-heel, comfortable shoes.  Practice good posture.  You may continue to have sex unless your health care provider tells you otherwise. Relieving pain and discomfort  Take frequent breaks and rest with your legs elevated if you have leg cramps or low back pain.  Take warm sitz baths to soothe any pain or discomfort caused by hemorrhoids. Use hemorrhoid cream if your health care provider approves.  Wear a good support bra to prevent discomfort from breast tenderness.  If you develop varicose veins: ? Wear support pantyhose or compression stockings as told by your healthcare provider. ? Elevate your feet for 15 minutes, 3-4 times a day. Prenatal care  Write down your questions. Take them to your prenatal visits.  Keep all your prenatal visits as told by your health care provider. This is important. Safety  Wear your seat belt at all times when driving.  Make  a list of emergency phone numbers, including numbers for family, friends, the hospital, and police and fire departments. General instructions  Avoid cat litter boxes and soil used by cats. These carry germs that can cause birth defects in the baby. If you have a cat, ask someone to clean the litter box for you.  Do not travel far distances unless it is absolutely necessary and only with the approval of your health care provider.  Do not use hot tubs, steam rooms, or saunas.  Do not drink alcohol.  Do not use any products that contain nicotine or tobacco, such as cigarettes and e-cigarettes. If you need help quitting, ask your health care provider.  Do not use any medicinal herbs or unprescribed drugs. These chemicals affect the formation and growth of the baby.  Do not douche or use tampons or scented sanitary pads.  Do not cross your legs for long periods of time.  To prepare for the arrival of your baby: ? Take prenatal classes to understand, practice, and ask questions about labor and delivery. ? Make a trial run to the hospital. ? Visit the hospital and tour the maternity area. ? Arrange for maternity or paternity leave through employers. ? Arrange for family and friends to take care of pets while you are in the hospital. ? Purchase a rear-facing car seat and make sure you know how to install it in your car. ? Pack your hospital bag. ? Prepare the baby's nursery. Make sure to remove all pillows and stuffed animals from the baby's crib to prevent suffocation.  Visit your dentist if you have not gone during your pregnancy. Use a soft toothbrush to brush your teeth and be gentle when you floss. Contact a health care provider if:  You are unsure if you are in labor or if your water has broken.  You become dizzy.  You have mild pelvic cramps, pelvic pressure, or nagging pain in your abdominal area.  You have lower back pain.  You have persistent nausea, vomiting, or  diarrhea.  You have an unusual or bad smelling vaginal discharge.  You have pain when you urinate. Get help right away if:  Your water breaks before 37 weeks.  You have regular contractions less than 5 minutes apart before 37 weeks.  You have a fever.  You are leaking fluid from your vagina.  You have spotting or bleeding from your vagina.  You have severe abdominal pain or cramping.  You have rapid weight loss or weight gain.  You have   shortness of breath with chest pain.  You notice sudden or extreme swelling of your face, hands, ankles, feet, or legs.  Your baby makes fewer than 10 movements in 2 hours.  You have severe headaches that do not go away when you take medicine.  You have vision changes. Summary  The third trimester is from week 28 through week 40, months 7 through 9. The third trimester is a time when the unborn baby (fetus) is growing rapidly.  During the third trimester, your discomfort may increase as you and your baby continue to gain weight. You may have abdominal, leg, and back pain, sleeping problems, and an increased need to urinate.  During the third trimester your breasts will keep growing and they will continue to become tender. A yellow fluid (colostrum) may leak from your breasts. This is the first milk you are producing for your baby.  False labor is a condition in which you feel small, irregular tightenings of the muscles in the womb (contractions) that eventually go away. These are called Braxton Hicks contractions. Contractions may last for hours, days, or even weeks before true labor sets in.  Signs of labor can include: abdominal cramps; regular contractions that start at 10 minutes apart and become stronger and more frequent with time; watery or bloody mucus discharge that comes from the vagina; increased pelvic pressure and dull back pain; and leaking of amniotic fluid. This information is not intended to replace advice given to you by your  health care provider. Make sure you discuss any questions you have with your health care provider. Document Revised: 02/01/2019 Document Reviewed: 11/16/2016 Elsevier Patient Education  2020 [employer]  

## 2019-12-07 NOTE — Progress Notes (Signed)
Subjective  Fetal Movement? yes Contractions? no Leaking Fluid? no Vaginal Bleeding? no  Objective  BP 120/80   Wt 257 lb (116.6 kg)   LMP 05/26/2019 (Exact Date)   BMI 44.11 kg/m  General: NAD Pumonary: no increased work of breathing Abdomen: gravid, non-tender Extremities: no edema Psychiatric: mood appropriate, affect full  Review of ULTRASOUND.    I have personally reviewed images and report of recent ultrasound done at [facility]    Plan of management to be discussed with patient. Growth normal  Assessment  29 y.o. G2P1001 at [redacted]w[redacted]d by  03/01/2020, by Last Menstrual Period presenting for routine prenatal visit  Plan   Problem List Items Addressed This Visit    Supervision of high risk pregnancy, antepartum   Obesity affecting pregnancy, antepartum   BMI 40.0-44.9, adult (HCC)   [redacted] weeks gestation of pregnancy    -  Primary      pregnancy Problems (from 07/25/19 to present)    Problem Noted Resolved   Syncope 08/27/2019 by Kirby Funk, MD No   Overview Signed 08/27/2019 12:31 PM by Kirby Funk, MD    Reports multiple episodes of syncope while at work Will obtain EKG; normal cbc and electrolytes Consider 24hr holter monitor      Supervision of high risk pregnancy, antepartum 07/25/2019 by Tresea Mall, CNM No   Overview Addendum 12/07/2019  9:58 AM by Nadara Mustard, MD    Clinic [facility] Prenatal Labs  Dating LMP = [redacted]w[redacted]d [place] Blood type: A/Positive/-- (10/09 1002)   Genetic Screen Declines Antibody:Negative (10/09 1002)  Anatomic [place] Normal anatomy, ant placenta, female gender Rubella: 16.60 (10/09 1002) Varicella: Immune  GTT Early:  105             Third trimester:  RPR: Non Reactive (10/09 1002)   Rhogam n/a HBsAg: Negative (10/09 1002)   TDaP vaccine      2019   Flu Shot: HIV: Non Reactive (10/09 1002)   Baby Food     Breast              GBS:p  Contraception     BTL, needs consent [ ]  Pap:PP  CBB  No   CS/VBAC n/a   Support Person              The patient has been fully informed about all methods of contraception, both temporary and permanent. She understands that tubal ligation is meant to be permanent, absolute and irreversible. She was told that there is an approximately 1 in 400 chance of a pregnancy in the future after tubal ligation. She was told the short and long term complications of tubal ligation. She understands the risks from this surgery include, but are not limited to, the risks of anesthesia, hemorrhage, infection, perforation, and injury to adjacent structures, bowel, bladder and blood vessels.   Pt dersires PP tubal, either next day or interval discussed   Plans to breast feed  Glucola today  Finish ABX for recent UTI  The following were addressed during this visit:  Breastfeeding Education - Early initiation of breastfeeding  - The importance of exclusive breastfeeding  - Risks of giving your baby anything other than breast milk if you are breastfeeding  - Nonpharmacological pain relief methods for labor  - The importance of early skin-to-skin contact  - Rooming-in on a 24-hour basis  - Feeding on demand or baby-led feeding  - Frequent feeding to help assure optimal milk production  - Effective positioning and attachment  -  Exclusive breastfeeding for the first 6 months  - Individualized Education   Barnett Applebaum, MD, [facility], [facility] 12/07/2019  9:58 AM

## 2019-12-08 LAB — 28 WEEKS RH-PANEL
Antibody Screen: NEGATIVE
Basophils Absolute: 0 10*3/uL (ref 0.0–0.2)
Basos: 0 %
EOS (ABSOLUTE): 0.3 10*3/uL (ref 0.0–0.4)
Eos: 4 %
Gestational Diabetes Screen: 131 mg/dL (ref 65–139)
HIV Screen 4th Generation wRfx: NONREACTIVE
Hematocrit: 31.5 % — ABNORMAL LOW (ref 34.0–46.6)
Hemoglobin: 11.1 g/dL (ref 11.1–15.9)
Immature Grans (Abs): 0 10*3/uL (ref 0.0–0.1)
Immature Granulocytes: 0 %
Lymphocytes Absolute: 2 10*3/uL (ref 0.7–3.1)
Lymphs: 25 %
MCH: 32.2 pg (ref 26.6–33.0)
MCHC: 35.2 g/dL (ref 31.5–35.7)
MCV: 91 fL (ref 79–97)
Monocytes Absolute: 0.5 10*3/uL (ref 0.1–0.9)
Monocytes: 7 %
Neutrophils Absolute: 5.1 10*3/uL (ref 1.4–7.0)
Neutrophils: 64 %
Platelets: 188 10*3/uL (ref 150–450)
RBC: 3.45 x10E6/uL — ABNORMAL LOW (ref 3.77–5.28)
RDW: 12.8 % (ref 11.7–15.4)
RPR Ser Ql: NONREACTIVE
WBC: 7.8 10*3/uL (ref 3.4–10.8)

## 2019-12-09 ENCOUNTER — Encounter: Payer: Self-pay | Admitting: Obstetrics and Gynecology

## 2019-12-12 ENCOUNTER — Emergency Department: Payer: Medicaid Other

## 2019-12-12 ENCOUNTER — Encounter: Payer: Self-pay | Admitting: Emergency Medicine

## 2019-12-12 ENCOUNTER — Emergency Department
Admission: EM | Admit: 2019-12-12 | Discharge: 2019-12-12 | Disposition: A | Discharge status: Home / Self Care | Payer: Medicaid Other | Attending: Emergency Medicine | Admitting: Emergency Medicine

## 2019-12-12 ENCOUNTER — Other Ambulatory Visit: Payer: Self-pay

## 2019-12-12 DIAGNOSIS — W010XXA Fall on same level from slipping, tripping and stumbling without subsequent striking against object, initial encounter: Secondary | ICD-10-CM | POA: Diagnosis not present

## 2019-12-12 DIAGNOSIS — M79601 Pain in right arm: Secondary | ICD-10-CM | POA: Insufficient documentation

## 2019-12-12 DIAGNOSIS — Y93E1 Activity, personal bathing and showering: Secondary | ICD-10-CM | POA: Insufficient documentation

## 2019-12-12 DIAGNOSIS — Y999 Unspecified external cause status: Secondary | ICD-10-CM | POA: Diagnosis not present

## 2019-12-12 DIAGNOSIS — R52 Pain, unspecified: Secondary | ICD-10-CM

## 2019-12-12 DIAGNOSIS — Y92099 Unspecified place in other non-institutional residence as the place of occurrence of the external cause: Secondary | ICD-10-CM | POA: Diagnosis not present

## 2019-12-12 DIAGNOSIS — O26893 Other specified pregnancy related conditions, third trimester: Secondary | ICD-10-CM | POA: Diagnosis present

## 2019-12-12 DIAGNOSIS — Z3A28 28 weeks gestation of pregnancy: Secondary | ICD-10-CM | POA: Diagnosis not present

## 2019-12-12 MED ORDER — ACETAMINOPHEN 500 MG PO TABS
1000.0000 mg | ORAL_TABLET | Freq: Once | ORAL | Status: AC
  Administered 2019-12-12: 1000 mg via ORAL
  Filled 2019-12-12: qty 2

## 2019-12-12 NOTE — Discharge Instructions (Addendum)
Please wear your sling/splint as needed for comfort.  Please take Tylenol 1000 mg every 6 hours as needed for discomfort.  Please follow-up with orthopedics by calling the number provided.  Please also inform her OB/GYN of today's ER visit.  Return to the emergency department for any worsening pain, any chest pain or trouble breathing, or any other symptom personally concerning to yourself.

## 2019-12-12 NOTE — ED Provider Notes (Signed)
[facility] Emergency Department Provider Note  Time seen: 8:52 AM  I have reviewed the triage vital signs and the nursing notes.   HISTORY  Chief Complaint Extremity Pain   HPI Janice Mccall is a 29 y.o. female [redacted] weeks pregnant who presents to the emergency department for right upper extremity discomfort.  According to the patient for the past 5 days she has been experiencing right upper extremity pain.  Patient states she did fall in the shower and caught herself with her right upper extremity just prior to the pain starting.  Patient states during her last pregnancy she was diagnosed with carpal tunnel syndrome in the right upper extremity which this feels similar to.  Patient did not undergo any operative treatment as she was pregnant was placed in a brace with some improvement.  Patient has not followed up since her last delivery.  Patient has no weakness in the arm, range of motion is limited due to pain/discomfort.  Patient appears to have pain throughout the arm and the joints as well as muscles.  Neurovascularly intact.  Patient denies any chest pain shortness of breath cough or fever.   Past Medical History:  Diagnosis Date  . Obesity   . Pregnancy induced hypertension   . Thyroid condition     Patient Active Problem List   Diagnosis Date Noted  . Pelvic pressure in pregnancy, antepartum, third trimester 12/05/2019  . Pelvic pressure in pregnancy, antepartum, second trimester 12/05/2019  . Dizzy 10/23/2019  . Syncope 08/27/2019  . Supervision of high risk pregnancy, antepartum 07/25/2019  . Obesity affecting pregnancy, antepartum 07/25/2019  . BMI 40.0-44.9, adult ([place]) 07/25/2019  . Hyperprolactinemia ([place]) 12/02/2017  . Subclinical hyperthyroidism 09/07/2017    Past Surgical History:  Procedure Laterality Date  . HERNIA REPAIR      Prior to Admission medications   Medication Sig Start Date End Date Taking? Authorizing Provider   acetaminophen (TYLENOL) 500 MG tablet Take 500 mg by mouth every 6 (six) hours as needed.    [provider]  cephALEXin (KEFLEX) 500 MG capsule Take 1 capsule (500 mg total) by mouth 3 (three) times daily for 7 days. 12/05/19 12/12/19  Dalia Heading, CNM  Prenatal Vit-Fe Fumarate-FA (MULTIVITAMIN-PRENATAL) 27-0.8 MG TABS tablet Take 1 tablet by mouth daily at 12 noon.    [provider]  terconazole (TERAZOL 3) 80 MG vaginal suppository Insert one suppository PV every other night x 3 doses 12/05/19   Dalia Heading, CNM  prochlorperazine (COMPAZINE) 10 MG tablet Take 1 tablet (10 mg total) by mouth every 6 (six) hours as needed for nausea or vomiting. 08/17/19 08/20/19  Rexene Agent, CNM    Allergies  Allergen Reactions  . Prochlorperazine Swelling    Family History  Problem Relation Age of Onset  . Hypertension Maternal Grandmother   . Hypertension Paternal Grandmother     Social History Social History   Tobacco Use  . Smoking status: Former Smoker    Packs/day: 0.50    Types: Cigarettes  . Smokeless tobacco: Never Used  Substance Use Topics  . Alcohol use: No  . Drug use: Never    Review of Systems Constitutional: Negative for fever. Cardiovascular: Negative for chest pain. Respiratory: Negative for shortness of breath. Gastrointestinal: Negative for abdominal pain Musculoskeletal: Right upper extremity pain Neurological: Negative for headache All other ROS negative  ____________________________________________   PHYSICAL EXAM:  VITAL SIGNS: ED Triage Vitals  Enc Vitals Group  BP 12/12/19 0830 128/79     Pulse Rate 12/12/19 0830 (!) 110     Resp 12/12/19 0830 17     Temp 12/12/19 0830 98.3 F (36.8 C)     Temp Source 12/12/19 0830 Oral     SpO2 12/12/19 0830 97 %     Weight 12/12/19 0818 257 lb (116.6 kg)     Height 12/12/19 0818 5\' 4"  (1.626 m)     Head Circumference --      Peak Flow --      Pain Score 12/12/19 0817 10      Pain Loc --      Pain Edu? --      Excl. in GC? --    Constitutional: Alert and oriented. Well appearing and in no distress. Eyes: Normal exam ENT      Head: Normocephalic and atraumatic      Mouth/Throat: Mucous membranes are moist. Cardiovascular: Normal rate, regular rhythm.  Respiratory: Normal respiratory effort without tachypnea nor retractions. Breath sounds are clear Gastrointestinal: Soft and nontender. No distention. Musculoskeletal: Right upper extremity tenderness to palpation throughout as well with active and passive range of motion.  Neurovascular intact distally.  No erythema or warmth. Neurologic:  Normal speech and language. No gross focal neurologic deficits.  Range of motion is somewhat limited in the right upper extremity but this is due to pain, when pushed patient is able to move right upper extremity. Skin:  Skin is warm, dry and intact.  Psychiatric: Mood and affect are normal.   ____________________________________________   RADIOLOGY  Ultrasound negative for DVT.  ____________________________________________   INITIAL IMPRESSION / ASSESSMENT AND PLAN / ED COURSE  Pertinent labs & imaging results that were available during my care of the patient were reviewed by me and considered in my medical decision making (see chart for details).   Patient presents to the emergency department for right upper extremity pain.  Patient is [redacted] weeks pregnant.  No history of DVT previously.  Will obtain a right upper extremity DVT study as a precaution.  Highly suspect musculoskeletal pain possible carpal tunnel.  We will treat with Tylenol and place the patient in a brace if ultrasound negative  Ultrasound negative for DVT.  We will have the patient follow-up with orthopedics.  We will place in a sling/splint for patient comfort.  Patient agreeable to plan of care.  Janice Mccall was evaluated in Emergency Department on 12/12/2019 for the symptoms described in the  history of present illness. She was evaluated in the context of the global COVID-19 pandemic, which necessitated consideration that the patient might be at risk for infection with the SARS-CoV-2 virus that causes COVID-19. Institutional protocols and algorithms that pertain to the evaluation of patients at risk for COVID-19 are in a state of rapid change based on information released by regulatory bodies including the CDC and federal and state organizations. These policies and algorithms were followed during the patient's care in the ED.  ____________________________________________   FINAL CLINICAL IMPRESSION(S) / ED DIAGNOSES  Right upper extremity pain   12/14/2019, MD 12/12/19 [phone]

## 2019-12-12 NOTE — ED Triage Notes (Signed)
Pt states pain to right arm. Pt states that she thinks it is carpal tunnel but reports the pain started in her shoulder and now radiates down to her hand. Pt denies SOB, CP or other symptoms but reports her arm is numb and tinging.

## 2019-12-25 ENCOUNTER — Telehealth: Payer: Self-pay

## 2019-12-25 ENCOUNTER — Other Ambulatory Visit: Payer: Self-pay

## 2019-12-25 ENCOUNTER — Inpatient Hospital Stay
Admission: EM | Admit: 2019-12-25 | Discharge: 2019-12-25 | Disposition: A | Discharge status: Home / Self Care | Payer: Medicaid Other | Attending: Obstetrics and Gynecology | Admitting: Obstetrics and Gynecology

## 2019-12-25 ENCOUNTER — Ambulatory Visit (INDEPENDENT_AMBULATORY_CARE_PROVIDER_SITE_OTHER): Payer: Medicaid Other | Admitting: Obstetrics and Gynecology

## 2019-12-25 ENCOUNTER — Encounter: Payer: Self-pay | Admitting: Obstetrics and Gynecology

## 2019-12-25 VITALS — BP 120/70 | Wt 266.0 lb

## 2019-12-25 DIAGNOSIS — R110 Nausea: Secondary | ICD-10-CM | POA: Diagnosis not present

## 2019-12-25 DIAGNOSIS — O0990 Supervision of high risk pregnancy, unspecified, unspecified trimester: Secondary | ICD-10-CM

## 2019-12-25 DIAGNOSIS — O409XX0 Polyhydramnios, unspecified trimester, not applicable or unspecified: Secondary | ICD-10-CM

## 2019-12-25 DIAGNOSIS — O2343 Unspecified infection of urinary tract in pregnancy, third trimester: Secondary | ICD-10-CM

## 2019-12-25 DIAGNOSIS — Z3A30 30 weeks gestation of pregnancy: Secondary | ICD-10-CM | POA: Diagnosis not present

## 2019-12-25 DIAGNOSIS — R109 Unspecified abdominal pain: Secondary | ICD-10-CM | POA: Diagnosis present

## 2019-12-25 DIAGNOSIS — O479 False labor, unspecified: Secondary | ICD-10-CM

## 2019-12-25 DIAGNOSIS — O26893 Other specified pregnancy related conditions, third trimester: Secondary | ICD-10-CM | POA: Diagnosis not present

## 2019-12-25 DIAGNOSIS — O403XX0 Polyhydramnios, third trimester, not applicable or unspecified: Secondary | ICD-10-CM

## 2019-12-25 DIAGNOSIS — R102 Pelvic and perineal pain: Secondary | ICD-10-CM

## 2019-12-25 DIAGNOSIS — N949 Unspecified condition associated with female genital organs and menstrual cycle: Secondary | ICD-10-CM

## 2019-12-25 DIAGNOSIS — O4700 False labor before 37 completed weeks of gestation, unspecified trimester: Secondary | ICD-10-CM

## 2019-12-25 DIAGNOSIS — O26899 Other specified pregnancy related conditions, unspecified trimester: Secondary | ICD-10-CM

## 2019-12-25 DIAGNOSIS — O09893 Supervision of other high risk pregnancies, third trimester: Secondary | ICD-10-CM

## 2019-12-25 LAB — CBC
HCT: 32.1 % — ABNORMAL LOW (ref 36.0–46.0)
Hemoglobin: 10.7 g/dL — ABNORMAL LOW (ref 12.0–15.0)
MCH: 32 pg (ref 26.0–34.0)
MCHC: 33.3 g/dL (ref 30.0–36.0)
MCV: 96.1 fL (ref 80.0–100.0)
Platelets: 154 10*3/uL (ref 150–400)
RBC: 3.34 MIL/uL — ABNORMAL LOW (ref 3.87–5.11)
RDW: 13.6 % (ref 11.5–15.5)
WBC: 7.5 10*3/uL (ref 4.0–10.5)
nRBC: 0 % (ref 0.0–0.2)

## 2019-12-25 LAB — COMPREHENSIVE METABOLIC PANEL
ALT: 15 U/L (ref 0–44)
AST: 20 U/L (ref 15–41)
Albumin: 3 g/dL — ABNORMAL LOW (ref 3.5–5.0)
Alkaline Phosphatase: 89 U/L (ref 38–126)
Anion gap: 10 (ref 5–15)
BUN: 8 mg/dL (ref 6–20)
CO2: 17 mmol/L — ABNORMAL LOW (ref 22–32)
Calcium: 9.3 mg/dL (ref 8.9–10.3)
Chloride: 107 mmol/L (ref 98–111)
Creatinine, Ser: 0.47 mg/dL (ref 0.44–1.00)
GFR calc Af Amer: >60 mL/min (ref >60)
GFR calc non Af Amer: >60 mL/min (ref >60)
Glucose, Bld: 93 mg/dL (ref 70–99)
Potassium: 3.9 mmol/L (ref 3.5–5.1)
Sodium: 134 mmol/L — ABNORMAL LOW (ref 135–145)
Total Bilirubin: 0.5 mg/dL (ref 0.3–1.2)
Total Protein: 6.6 g/dL (ref 6.5–8.1)

## 2019-12-25 LAB — LIPASE, BLOOD: Lipase: 31 U/L (ref 11–51)

## 2019-12-25 LAB — TSH: TSH: 0.364 u[IU]/mL (ref 0.350–4.500)

## 2019-12-25 LAB — T4, FREE: Free T4: 0.48 ng/dL — ABNORMAL LOW (ref 0.61–1.12)

## 2019-12-25 MED ORDER — ONDANSETRON 4 MG PO TBDP: 4.0000 mg | ORAL_TABLET | Freq: Four times a day (QID) | ORAL | Status: DC | PRN

## 2019-12-25 MED ORDER — ACETAMINOPHEN 325 MG PO TABS: 650.0000 mg | ORAL_TABLET | ORAL | Status: DC | PRN

## 2019-12-25 NOTE — Progress Notes (Signed)
Routine Prenatal Care Visit  Subjective  Janice Mccall is a 29 y.o. G2P1001 at [redacted]w[redacted]d being seen today for ongoing prenatal care.  She is currently monitored for the following issues for this high-risk pregnancy and has Hyperprolactinemia (HCC); Subclinical hyperthyroidism; Supervision of high risk pregnancy, antepartum; Obesity affecting pregnancy, antepartum; BMI 40.0-44.9, adult (HCC); Syncope; Dizzy; Pelvic pressure in pregnancy, antepartum, third trimester; and Pelvic pressure in pregnancy, antepartum, second trimester on their problem list.  ----------------------------------------------------------------------------------- Patient reports pelvic pain and pressure which has been present for several days. She has cramping that is coming and going. She feels like she might need to push. She had recent intercourse in the last 24 hours. She describes the pain as midline above the pubic pain. She had a recent UTI which she took antibiotics for.  Denies pain with urination. she denies vaginal discharge or leakage of fluid.  She reports decreased fetal movement.  She is having random sweating and started throwing up this morning. Contractions: Not present. Vag. Bleeding: None.  Movement: Present. Denies leaking of fluid.  ----------------------------------------------------------------------------------- The following portions of the patient's history were reviewed and updated as appropriate: allergies, current medications, past family history, past medical history, past social history, past surgical history and problem list. Problem list updated.   Objective  Blood pressure 120/70, weight 266 lb (120.7 kg), last menstrual period 05/26/2019, not currently breastfeeding. Pregravid weight 244 lb (110.7 kg) Total Weight Gain 22 lb (9.979 kg) Urinalysis:      Fetal Status: Fetal Heart Rate (bpm): 150   Movement: Present     General:  Alert, oriented and cooperative. Patient is in no acute  distress.  Skin: Skin is warm and dry. No rash noted.   Cardiovascular: Normal heart rate noted  Respiratory: Normal respiratory effort, no problems with respiration noted  Abdomen: Soft, gravid, appropriate for gestational age. Pain/Pressure: Present     Pelvic:  Cervical exam performed Dilation: 1 Effacement (%): 20 Station: -3  Extremities: Normal range of motion.  Edema: None  Mental Status: Normal mood and affect. Normal behavior. Normal judgment and thought content.    Assessment   29 y.o. G2P1001 at [redacted]w[redacted]d by  03/01/2020, by Last Menstrual Period presenting for routine prenatal visit  Plan   pregnancy Problems (from 07/25/19 to present)    Problem Noted Resolved   Syncope 08/27/2019 by Kirby Funk, MD No   Overview Signed 08/27/2019 12:31 PM by Kirby Funk, MD    Reports multiple episodes of syncope while at work Will obtain EKG; normal cbc and electrolytes Consider 24hr holter monitor      Supervision of high risk pregnancy, antepartum 07/25/2019 by Tresea Mall, CNM No   Overview Addendum 12/25/2019  4:22 PM by Natale Milch, MD    Clinic [facility] Prenatal Labs  Dating LMP = [redacted]w[redacted]d [place] Blood type: A/Positive/-- (10/09 1002)   Genetic Screen Declines Antibody:Negative (10/09 1002)  Anatomic [place] Normal anatomy, ant placenta, female gender Rubella: 16.60 (10/09 1002) Varicella: Immune  GTT Early:  105              Third trimester: 131 RPR: Non Reactive (10/09 1002)   Rhogam n/a HBsAg: Negative (10/09 1002)   TDaP vaccine      2019   Flu Shot: declines HIV: Non Reactive (10/09 1002)   Baby Food     Breast              GBS:   Contraception     BTL, needs  consent [ ]  Pap: will need postpartum  CBB  No   CS/VBAC n/a   Support Person                Gestational age appropriate obstetric precautions including but not limited to vaginal bleeding, contractions, leaking of fluid and fetal movement were reviewed in detail with the patient.    Recent UTI- will send  Urine culture Nuswab collected and sent Asked patient to proceed to labor and delivery for monitoring.  FFN and swab collected and given to patient can be sent at the discretion of Dr. Georgianne Fick.  Polyhydramnios on previous ultrasound in February.  We will repeat ultrasound with next visit on 3/12. Discussed supportive care for pelvic pain during pregnancy.   No follow-ups on file.  Homero Fellers MD [facility] 12/25/2019, 4:22 PM

## 2019-12-25 NOTE — Telephone Encounter (Signed)
Patient is schedule for 12/25/19 at 3:30 with CRS

## 2019-12-25 NOTE — Progress Notes (Signed)
ROB C/o severe pelvic pain/back pain, with random sweating, started throwing up this morning x3 Denies lof, no vb, Slower FM

## 2019-12-25 NOTE — Telephone Encounter (Signed)
Spoke w/patient. She explains that this has been going on for 2 weeks constantly. She finished medicine for UTI and yeast infection. Feels this way anytime she moves to sit up or get out of bed/car. Denies spotting/bleeding/leaking fluid.

## 2019-12-25 NOTE — Telephone Encounter (Signed)
Okay to schedule with me

## 2019-12-25 NOTE — Discharge Summary (Signed)
Physician Final Progress Note  Patient ID: Janice Mccall MRN: [id number] DOB/AGE: [birth year] 29 y.o.  Admit date: 12/25/2019 Admitting provider: Malachy Mood, MD Discharge date: 12/25/2019   Admission Diagnoses: Abdominal pain  Discharge Diagnoses: Abdominal pain  29 y.o. G2P1001 at [redacted]w[redacted]d sent from office for abdominal pain, nausea, and noted to be 1cm dilated in clinic.  The pain described by the patient is bilateral lower groin, it is positional, and consistent in description with round ligament pain.  Her nausea is longstanding this pregnancy with MFM consult earlier in the pregnancy.   +FM, no LOF, no VB.  Fetal monitoring showed a reactive fetal heart tracing, no evidence of contractions.  Laboratory work up including CMP, CBC, lipase, TSH and T4 normal.  She does have a history of recent UTI but was unable to void during the admission.  Given no other urinary symptoms and lack of frequency low suspicion for UTI.  Patient asked to be discharged secondary to child care issues. Triage was done remotely with nursing staff.  pregnancy Problems (from 07/25/19 to present)    Problem Noted Resolved   Syncope 08/27/2019 by Wynona Neat, MD No   Overview Signed 08/27/2019 12:31 PM by Wynona Neat, MD    Reports multiple episodes of syncope while at work Will obtain EKG; normal cbc and electrolytes Consider 24hr holter monitor      Supervision of high risk pregnancy, antepartum 07/25/2019 by Rod Can, CNM No   Overview Addendum 12/25/2019  4:22 PM by Homero Fellers, MD    [facility]inic [facility] Prenatal Labs  Dating LMP = [redacted]w[redacted]d [place] Blood type: A/Positive/-- (10/09 1002)   Genetic Screen Declines Antibody:Negative (10/09 1002)  Anatomic [place] Normal anatomy, ant placenta, female gender Rubella: 16.60 (10/09 1002) Varicella: Immune  GTT Early:  105              Third trimester: 131 RPR: Non Reactive (10/09 1002)   Rhogam n/a HBsAg: Negative (10/09 1002)   TDaP vaccine      2019   Flu  Shot: declines HIV: Non Reactive (10/09 1002)   Baby Food     Breast              GBS:   Contraception     BTL, needs consent [ ]  Pap: will need postpartum  CBB  No   CS/VBAC n/a   Support Person              Temp:  [98.2 F (36.8 C)] 98.2 F (36.8 C) (03/02 1719) Pulse Rate:  [95] 95 (03/02 1719) Resp:  [16] 16 (03/02 1719) BP: (115-120)/(69-70) 115/69 (03/02 1719) Weight:  [120.7 kg] 120.7 kg (03/02 1719)   Consults: None  Significant Findings/ Diagnostic Studies:  Results for orders placed or performed during the hospital encounter of 12/25/19 (from the past 24 hour(s))  Comprehensive metabolic panel     Status: Abnormal   Collection Time: 12/25/19  5:22 PM  Result Value Ref Range   Sodium 134 (L) 135 - 145 mmol/L   Potassium 3.9 3.5 - 5.1 mmol/L   Chloride 107 98 - 111 mmol/L   CO2 17 (L) 22 - 32 mmol/L   Glucose, Bld 93 70 - 99 mg/dL   BUN 8 6 - 20 mg/dL   Creatinine, Ser 0.47 0.44 - 1.00 mg/dL   Calcium 9.3 8.9 - 10.3 mg/dL   Total Protein 6.6 6.5 - 8.1 g/dL   Albumin 3.0 (L) 3.5 - 5.0 g/dL   AST 20  15 - 41 U/L   ALT 15 0 - 44 U/L   Alkaline Phosphatase 89 38 - 126 U/L   Total Bilirubin 0.5 0.3 - 1.2 mg/dL   GFR calc non Af Amer >60 >60 mL/min   GFR calc Af Amer >60 >60 mL/min   Anion gap 10 5 - 15  Lipase, blood     Status: None   Collection Time: 12/25/19  5:22 PM  Result Value Ref Range   Lipase 31 11 - 51 U/L  TSH     Status: None   Collection Time: 12/25/19  5:22 PM  Result Value Ref Range   TSH 0.364 0.350 - 4.500 uIU/mL  T4, free     Status: Abnormal   Collection Time: 12/25/19  5:22 PM  Result Value Ref Range   Free T4 0.48 (L) 0.61 - 1.12 ng/dL  CBC     Status: Abnormal   Collection Time: 12/25/19  5:22 PM  Result Value Ref Range   WBC 7.5 4.0 - 10.5 K/uL   RBC 3.34 (L) 3.87 - 5.11 MIL/uL   Hemoglobin 10.7 (L) 12.0 - 15.0 g/dL   HCT 70.6 (L) 23.7 - 62.8 %   MCV 96.1 80.0 - 100.0 fL   MCH 32.0 26.0 - 34.0 pg   MCHC 33.3 30.0 - 36.0 g/dL    RDW 31.5 17.6 - 16.0 %   Platelets 154 150 - 400 K/uL   nRBC 0.0 0.0 - 0.2 %     Procedures:  Baseline: 140 Variability: moderate Accelerations: present Decelerations: absent Tocometry: none The patient was monitored for 30 minutes, fetal heart rate tracing was deemed reactive, category I tracing,  CPT [id number]   Discharge Condition: good  Disposition: Discharge disposition: 01-Home or Self Care       Diet: Regular diet  Discharge Activity: Activity as tolerated   Allergies as of 12/25/2019      Reactions   Prochlorperazine Swelling      Medication List    TAKE these medications   acetaminophen 500 MG tablet Commonly known as: TYLENOL Take 500 mg by mouth every 6 (six) hours as needed.   multivitamin-prenatal 27-0.8 MG Tabs tablet Take 1 tablet by mouth daily at 12 noon.        Total time spent taking care of this patient: triaged remotely  Signed: Vena Austria 12/25/2019, 9:52 PM

## 2019-12-25 NOTE — OB Triage Note (Signed)
Patient was sent over from the prenatal office to labor and delivery for complaints of abdominal discomfort, sweating, and vomiting. Patient states she has had abdominal cramping for several days and points to discomfort in the right and left groin areas. Patient states that the abdominal discomfort is more uncomfortable when she sits up from laying down or is up on her feet. Patient states she last had sex yesterday and denies any vaginal bleeding or discharge. Patient states she has been sweating for several days but states that she is not currently sweating, but "just a little hot". Patient does not have a fever (Temp: 98.2) and is not warm to touch. Patient states she has had vomiting from 0800-1400 this morning after eating a bagel. She denies eating anything in the last 24 hours that were abnormal for her/would irritate her stomach. She states her emesis was "projectile" and "was continuous all morning and was clear and a large amount". She states she has not gotten sick since 1400. She states she has had vomiting throughout her entire pregnancy. Patient reports + FM. Vital signs stable. Monitors applied and assessing.

## 2019-12-25 NOTE — Telephone Encounter (Signed)
Patient is [redacted] wks pregnant reporting intense severe pain. "feels like something is tearing in her pelvic area". She hasn't slept.cCb#[phone]

## 2019-12-25 NOTE — Progress Notes (Signed)
Patient asked if she would be able to go home soon. RN spoke to MD about lab results (except for urinalysis because patient unable to urinate) and MD gave verbal order for discharge. RN discussed plan for discharge to patient and patient verbalized understanding and agreed with plan of care. RN discussed discharge instructions and when to return to hospital.

## 2019-12-27 LAB — URINE CULTURE

## 2019-12-29 LAB — NUSWAB VAGINITIS PLUS (VG+)
Candida albicans, NAA: POSITIVE — AB
Candida glabrata, NAA: NEGATIVE
Chlamydia trachomatis, NAA: NEGATIVE
Neisseria gonorrhoeae, NAA: NEGATIVE
Trich vag by NAA: NEGATIVE

## 2019-12-31 ENCOUNTER — Other Ambulatory Visit: Payer: Self-pay | Admitting: Obstetrics and Gynecology

## 2019-12-31 DIAGNOSIS — N760 Acute vaginitis: Secondary | ICD-10-CM

## 2019-12-31 DIAGNOSIS — O23593 Infection of other part of genital tract in pregnancy, third trimester: Secondary | ICD-10-CM

## 2019-12-31 MED ORDER — TERCONAZOLE 0.4 % VA CREA: 1.0000 | TOPICAL_CREAM | Freq: Every day | VAGINAL | 0 refills | Status: DC

## 2020-01-03 ENCOUNTER — Encounter: Payer: Self-pay | Admitting: Advanced Practice Midwife

## 2020-01-03 ENCOUNTER — Ambulatory Visit (INDEPENDENT_AMBULATORY_CARE_PROVIDER_SITE_OTHER): Payer: Medicaid Other | Admitting: Advanced Practice Midwife

## 2020-01-03 ENCOUNTER — Other Ambulatory Visit: Payer: Self-pay

## 2020-01-03 ENCOUNTER — Ambulatory Visit (INDEPENDENT_AMBULATORY_CARE_PROVIDER_SITE_OTHER): Payer: Medicaid Other

## 2020-01-03 VITALS — BP 126/88 | Wt 263.0 lb

## 2020-01-03 DIAGNOSIS — Z3A31 31 weeks gestation of pregnancy: Secondary | ICD-10-CM | POA: Diagnosis not present

## 2020-01-03 DIAGNOSIS — O0993 Supervision of high risk pregnancy, unspecified, third trimester: Secondary | ICD-10-CM

## 2020-01-03 DIAGNOSIS — O0990 Supervision of high risk pregnancy, unspecified, unspecified trimester: Secondary | ICD-10-CM

## 2020-01-03 DIAGNOSIS — O403XX0 Polyhydramnios, third trimester, not applicable or unspecified: Secondary | ICD-10-CM | POA: Diagnosis not present

## 2020-01-03 DIAGNOSIS — O409XX0 Polyhydramnios, unspecified trimester, not applicable or unspecified: Secondary | ICD-10-CM

## 2020-01-03 NOTE — Progress Notes (Addendum)
Routine Prenatal Care Visit  Subjective  Janice Mccall is a 29 y.o. G2P1001 at [redacted]w[redacted]d being seen today for ongoing prenatal care.  She is currently monitored for the following issues for this high-risk pregnancy and has Hyperprolactinemia ([place]); Subclinical hyperthyroidism; Supervision of high risk pregnancy, antepartum; Obesity affecting pregnancy, antepartum; BMI 40.0-44.9, adult ([place]); Syncope; Dizzy; Pelvic pressure in pregnancy, antepartum, third trimester; and Polyhydramnios affecting pregnancy in third trimester on their problem list.  ----------------------------------------------------------------------------------- Patient reports feeling tired and various aches and pains in her abdomen and pelvis.  She is weary of the pregnancy. We discussed earliest delivery at 39 weeks unless there is a medical reason to deliver sooner. We will continue to evaluate. We reviewed the results of today's AFI- polyhydramnios- and will do growth/afi at next visit. She signed tubal consent today.  Contractions: Not present. Vag. Bleeding: None.  Movement: Present. Leaking Fluid denies.  ----------------------------------------------------------------------------------- The following portions of the patient's history were reviewed and updated as appropriate: allergies, current medications, past family history, past medical history, past social history, past surgical history and problem list. Problem list updated.  Objective  Blood pressure 126/88, weight 263 lb (119.3 kg), last menstrual period 05/26/2019. Pregravid weight 244 lb (110.7 kg) Total Weight Gain 19 lb (8.618 kg) Urinalysis: Urine Protein    Urine Glucose    Fetal Status: Fetal Heart Rate (bpm): 141   Movement: Present      AFI: 76.1 cm, cephalic, anterior placenta  General:  Alert, oriented and cooperative. Patient is in no acute distress.  Skin: Skin is warm and dry. No rash noted.   Cardiovascular: Normal heart rate noted    Respiratory: Normal respiratory effort, no problems with respiration noted  Abdomen: Soft, gravid, appropriate for gestational age. Pain/Pressure: Present     Pelvic:  Cervical exam deferred        Extremities: Normal range of motion.     Mental Status: Normal mood and affect. Normal behavior. Normal judgment and thought content.   Assessment   29 y.o. G2P1001 at [redacted]w[redacted]d by  03/01/2020, by Last Menstrual Period presenting for routine prenatal visit  Plan   pregnancy Problems (from 07/25/19 to present)    Problem Noted Resolved   Polyhydramnios affecting pregnancy in third trimester 01/03/2020 by Rod Can, CNM No   Syncope 08/27/2019 by Wynona Neat, MD No   Overview Signed 08/27/2019 12:31 PM by Wynona Neat, MD    Reports multiple episodes of syncope while at work Will obtain EKG; normal cbc and electrolytes Consider 24hr holter monitor      Supervision of high risk pregnancy, antepartum 07/25/2019 by Rod Can, CNM No   Overview Addendum 12/25/2019  4:22 PM by Homero Fellers, [place]  Dating LMP = [redacted]w[redacted]d [place] Blood type: A/Positive/-- (10/09 1002)   Genetic Screen Declines Antibody:Negative (10/09 1002)  Anatomic [place] Normal anatomy, ant placenta, female gender Rubella: 16.60 (10/09 1002) Varicella: Immune  GTT Early:  105              Third trimester: 131 RPR: Non Reactive (10/09 1002)   Rhogam n/a HBsAg: Negative (10/09 1002)   TDaP vaccine      2019   Flu Shot: declines HIV: Non Reactive (10/09 1002)   Baby Food     Breast              GBS:   Contraception BTL consent signed [3/11] **needs to re-sign** Pap: will need postpartum  CBB  No   CS/VBAC n/a   Support Person                Preterm labor symptoms and general obstetric precautions including but not limited to vaginal bleeding, contractions, leaking of fluid and fetal movement were reviewed in detail with the patient.   Return in about 2 weeks (around 01/17/2020) for  growth/afi/rob.  Tresea Mall, CNM 01/03/2020 2:26 PM

## 2020-01-03 NOTE — Progress Notes (Signed)
AFI today. No vb. No lof.  

## 2020-01-04 ENCOUNTER — Encounter: Payer: Medicaid Other | Admitting: Certified Nurse Midwife

## 2020-01-04 ENCOUNTER — Observation Stay
Admission: EM | Admit: 2020-01-04 | Discharge: 2020-01-04 | Disposition: A | Discharge status: Home / Self Care | Payer: Medicaid Other | Attending: Obstetrics and Gynecology | Admitting: Obstetrics and Gynecology

## 2020-01-04 ENCOUNTER — Telehealth: Payer: Self-pay

## 2020-01-04 ENCOUNTER — Other Ambulatory Visit: Payer: Self-pay

## 2020-01-04 ENCOUNTER — Encounter: Payer: Self-pay | Admitting: Obstetrics and Gynecology

## 2020-01-04 DIAGNOSIS — O99213 Obesity complicating pregnancy, third trimester: Secondary | ICD-10-CM | POA: Insufficient documentation

## 2020-01-04 DIAGNOSIS — O368130 Decreased fetal movements, third trimester, not applicable or unspecified: Principal | ICD-10-CM | POA: Insufficient documentation

## 2020-01-04 DIAGNOSIS — Z79899 Other long term (current) drug therapy: Secondary | ICD-10-CM | POA: Insufficient documentation

## 2020-01-04 DIAGNOSIS — O0990 Supervision of high risk pregnancy, unspecified, unspecified trimester: Secondary | ICD-10-CM

## 2020-01-04 DIAGNOSIS — Z3A31 31 weeks gestation of pregnancy: Secondary | ICD-10-CM | POA: Insufficient documentation

## 2020-01-04 DIAGNOSIS — O403XX0 Polyhydramnios, third trimester, not applicable or unspecified: Secondary | ICD-10-CM

## 2020-01-04 DIAGNOSIS — Z87891 Personal history of nicotine dependence: Secondary | ICD-10-CM | POA: Insufficient documentation

## 2020-01-04 DIAGNOSIS — O368190 Decreased fetal movements, unspecified trimester, not applicable or unspecified: Secondary | ICD-10-CM | POA: Diagnosis present

## 2020-01-04 NOTE — Final Progress Note (Signed)
Physician Final Progress Note  Patient ID: Janice Mccall MRN: [id number] DOB/AGE: Jul 12, 1991 29 y.o.  Admit date: 01/04/2020 Admitting provider: Will Bonnet, MD Discharge date: 01/04/2020   Admission Diagnoses:  1) intrauterine pregnancy at [redacted]w[redacted]d  2) Decreased fetal movement  Discharge Diagnoses:  1) intrauterine pregnancy at [redacted]w[redacted]d  2) Decreased fetal movement  History of Present Illness: The patient is a 29 y.o. female G2P1001 at [redacted]w[redacted]d who presents for decreased fetal movement today.  She notes no contractions, fluid leaking, or vaginal bleeding.   Past Medical History:  Diagnosis Date  . Obesity   . Pregnancy induced hypertension   . Thyroid condition     Past Surgical History:  Procedure Laterality Date  . HERNIA REPAIR      No current facility-administered medications on file prior to encounter.   Current Outpatient Medications on File Prior to Encounter  Medication Sig Dispense Refill  . acetaminophen (TYLENOL) 500 MG tablet Take 500 mg by mouth every 6 (six) hours as needed.    . Prenatal Vit-Fe Fumarate-FA (MULTIVITAMIN-PRENATAL) 27-0.8 MG TABS tablet Take 1 tablet by mouth daily at 12 noon.    Marland Kitchen terconazole (TERAZOL 7) 0.4 % vaginal cream Place 1 applicator vaginally at bedtime. (Patient not taking: Reported on 01/04/2020) 45 g 0  . [DISCONTINUED] prochlorperazine (COMPAZINE) 10 MG tablet Take 1 tablet (10 mg total) by mouth every 6 (six) hours as needed for nausea or vomiting. 30 tablet 0    Allergies  Allergen Reactions  . Prochlorperazine Swelling    Social History   Socioeconomic History  . Marital status: Married    Spouse name: Natalea Sutliff  . Number of children: Not on file  . Years of education: Not on file  . Highest education level: Not on file  Occupational History  . Not on file  Tobacco Use  . Smoking status: Former Smoker    Packs/day: 0.50    Types: Cigarettes  . Smokeless tobacco: Never Used  Substance and Sexual  Activity  . Alcohol use: No  . Drug use: Never  . Sexual activity: Yes    Birth control/protection: Surgical  Other Topics Concern  . Not on file  Social History Narrative  . Not on file   Social Determinants of Health   Financial Resource Strain:   . Difficulty of Paying Living Expenses:   Food Insecurity:   . Worried About Charity fundraiser in the Last Year:   . Arboriculturist in the Last Year:   Transportation Needs:   . Film/video editor (Medical):   Marland Kitchen Lack of Transportation (Non-Medical):   Physical Activity:   . Days of Exercise per Week:   . Minutes of Exercise per Session:   Stress:   . Feeling of Stress :   Social Connections:   . Frequency of Communication with Friends and Family:   . Frequency of Social Gatherings with Friends and Family:   . Attends Religious Services:   . Active Member of Clubs or Organizations:   . Attends Archivist Meetings:   Marland Kitchen Marital Status:   Intimate Partner Violence:   . Fear of Current or Ex-Partner:   . Emotionally Abused:   Marland Kitchen Physically Abused:   . Sexually Abused:     Family History  Problem Relation Age of Onset  . Hypertension Maternal Grandmother   . Hypertension Paternal Grandmother      Review of Systems  Constitutional: Negative.   HENT: Negative.  Eyes: Negative.   Respiratory: Negative.   Cardiovascular: Negative.   Gastrointestinal: Negative.   Genitourinary: Negative.   Musculoskeletal: Negative.   Skin: Negative.   Neurological: Negative.   Psychiatric/Behavioral: Negative.      Physical Exam: BP 131/67 (BP Location: Left Arm)   Pulse (!) 101   Temp 99.2 F (37.3 C) (Oral)   Resp 18   Ht 5\' 4"  (1.626 m)   Wt 120.7 kg   LMP 05/26/2019 (Exact Date)   SpO2 99%   BMI 45.66 kg/m   Physical Exam Constitutional:      General: She is not in acute distress.    Appearance: Normal appearance. She is well-developed.  HENT:     Head: Normocephalic and atraumatic.  Eyes:      General: No scleral icterus.    Conjunctiva/sclera: Conjunctivae normal.  Cardiovascular:     Rate and Rhythm: Normal rate and regular rhythm.     Heart sounds: No murmur. No friction rub. No gallop.   Pulmonary:     Effort: Pulmonary effort is normal. No respiratory distress.     Breath sounds: Normal breath sounds. No wheezing or rales.  Abdominal:     General: Bowel sounds are normal.     Palpations: Abdomen is soft.     Comments: Gravid, NT  Musculoskeletal:        General: Normal range of motion.     Cervical back: Normal range of motion and neck supple.  Neurological:     General: No focal deficit present.     Mental Status: She is alert and oriented to person, place, and time.     Cranial Nerves: No cranial nerve deficit.  Skin:    General: Skin is warm and dry.     Findings: No erythema.  Psychiatric:        Mood and Affect: Mood normal.        Behavior: Behavior normal.        Judgment: Judgment normal.     Consults: None  Significant Findings/ Diagnostic Studies: none  Procedures:  NST: Baseline FHR: 145 beats/min Variability: moderate Accelerations: present Decelerations: absent Tocometry: infrequent contractions  Interpretation:  INDICATIONS: decreased fetal movement RESULTS:  A NST procedure was performed with FHR monitoring and a normal baseline established, appropriate time of 20-40 minutes of evaluation, and accels >2 seen w 15x15 characteristics.  Results show a REACTIVE NST.    Hospital Course: The patient was admitted to Labor and Delivery Triage for observation. She had normal vital signs.  The fetal tracing was reactive given gestational age.  She was thus reassured regarding the status of her fetus. She had no other complaints at this time.  She was discharged in stable condition.   Discharge Condition: stable  Disposition: Discharge disposition: 01-Home or Self Care       Diet: Regular diet  Discharge Activity: Activity as  tolerated   Allergies as of 01/04/2020      Reactions   Prochlorperazine Swelling      Medication List    STOP taking these medications   terconazole 0.4 % vaginal cream Commonly known as: TERAZOL 7     TAKE these medications   acetaminophen 500 MG tablet Commonly known as: TYLENOL Take 500 mg by mouth every 6 (six) hours as needed.   multivitamin-prenatal 27-0.8 MG Tabs tablet Take 1 tablet by mouth daily at 12 noon.        Total time spent taking care of this patient: 25 minutes  Signed: Thomasene Mohair, MD  01/04/2020, 6:29 PM

## 2020-01-04 NOTE — Telephone Encounter (Signed)
Pt left msg on triage line saying she hasnt felt baby move today. Advised to lay on side and drink soda, something cold. Says she has done that already and nothing. Advised pt to go to ER.

## 2020-01-04 NOTE — OB Triage Note (Signed)
Patient presents to labor and delivery with complaints of no fetal movement all da. She states she last felt baby move last night. She states she has tried to drink fluid, eat food, talk to the baby, lay on left side, and move around to illicit some movement and never could feel anything. Pt denies contraction pain, leaking of fluid, vaginal bleeding, and discharge. Monitors applied and assessing. Vital signs stable.

## 2020-01-04 NOTE — Discharge Summary (Signed)
Patient discharged home, Discharge instructions given, pt states understanding, pt denies any other needs at this time. Pt left in stable condition.

## 2020-01-07 ENCOUNTER — Telehealth: Payer: Self-pay

## 2020-01-09 ENCOUNTER — Telehealth: Payer: Self-pay

## 2020-01-09 ENCOUNTER — Encounter: Payer: Self-pay | Admitting: Obstetrics and Gynecology

## 2020-01-09 ENCOUNTER — Other Ambulatory Visit: Payer: Self-pay

## 2020-01-09 ENCOUNTER — Observation Stay
Admission: EM | Admit: 2020-01-09 | Discharge: 2020-01-09 | Disposition: A | Discharge status: Home / Self Care | Payer: Medicaid Other | Attending: Certified Nurse Midwife | Admitting: Certified Nurse Midwife

## 2020-01-09 DIAGNOSIS — O26893 Other specified pregnancy related conditions, third trimester: Secondary | ICD-10-CM | POA: Diagnosis present

## 2020-01-09 DIAGNOSIS — O0990 Supervision of high risk pregnancy, unspecified, unspecified trimester: Secondary | ICD-10-CM

## 2020-01-09 DIAGNOSIS — Z87891 Personal history of nicotine dependence: Secondary | ICD-10-CM | POA: Insufficient documentation

## 2020-01-09 DIAGNOSIS — O2343 Unspecified infection of urinary tract in pregnancy, third trimester: Secondary | ICD-10-CM | POA: Diagnosis not present

## 2020-01-09 DIAGNOSIS — O99213 Obesity complicating pregnancy, third trimester: Secondary | ICD-10-CM | POA: Insufficient documentation

## 2020-01-09 DIAGNOSIS — R112 Nausea with vomiting, unspecified: Secondary | ICD-10-CM | POA: Diagnosis not present

## 2020-01-09 DIAGNOSIS — O4703 False labor before 37 completed weeks of gestation, third trimester: Secondary | ICD-10-CM | POA: Diagnosis not present

## 2020-01-09 DIAGNOSIS — R109 Unspecified abdominal pain: Secondary | ICD-10-CM | POA: Insufficient documentation

## 2020-01-09 DIAGNOSIS — O403XX0 Polyhydramnios, third trimester, not applicable or unspecified: Secondary | ICD-10-CM

## 2020-01-09 DIAGNOSIS — E669 Obesity, unspecified: Secondary | ICD-10-CM | POA: Diagnosis not present

## 2020-01-09 DIAGNOSIS — Z3A32 32 weeks gestation of pregnancy: Secondary | ICD-10-CM | POA: Diagnosis not present

## 2020-01-09 DIAGNOSIS — Z8759 Personal history of other complications of pregnancy, childbirth and the puerperium: Secondary | ICD-10-CM | POA: Diagnosis not present

## 2020-01-09 DIAGNOSIS — O99613 Diseases of the digestive system complicating pregnancy, third trimester: Secondary | ICD-10-CM | POA: Insufficient documentation

## 2020-01-09 DIAGNOSIS — O218 Other vomiting complicating pregnancy: Secondary | ICD-10-CM | POA: Diagnosis not present

## 2020-01-09 DIAGNOSIS — Z87440 Personal history of urinary (tract) infections: Secondary | ICD-10-CM | POA: Diagnosis not present

## 2020-01-09 DIAGNOSIS — O212 Late vomiting of pregnancy: Secondary | ICD-10-CM | POA: Diagnosis not present

## 2020-01-09 DIAGNOSIS — Z8249 Family history of ischemic heart disease and other diseases of the circulatory system: Secondary | ICD-10-CM | POA: Diagnosis not present

## 2020-01-09 LAB — URINALYSIS, COMPLETE (UACMP) WITH MICROSCOPIC
Bilirubin Urine: NEGATIVE
Glucose, UA: NEGATIVE mg/dL
Hgb urine dipstick: NEGATIVE
Ketones, ur: NEGATIVE mg/dL
Nitrite: NEGATIVE
Protein, ur: NEGATIVE mg/dL
Specific Gravity, Urine: 1.009 (ref 1.005–1.030)
pH: 7 (ref 5.0–8.0)

## 2020-01-09 LAB — URINE DRUG SCREEN, QUALITATIVE (ARMC ONLY)
Amphetamines, Ur Screen: NOT DETECTED
Barbiturates, Ur Screen: NOT DETECTED
Benzodiazepine, Ur Scrn: NOT DETECTED
Cannabinoid 50 Ng, Ur ~~LOC~~: NOT DETECTED
Cocaine Metabolite,Ur ~~LOC~~: NOT DETECTED
MDMA (Ecstasy)Ur Screen: NOT DETECTED
Methadone Scn, Ur: NOT DETECTED
Opiate, Ur Screen: NOT DETECTED
Phencyclidine (PCP) Ur S: NOT DETECTED
Tricyclic, Ur Screen: NOT DETECTED

## 2020-01-09 MED ORDER — LACTATED RINGERS IV SOLN: INTRAVENOUS | Status: DC

## 2020-01-09 MED ORDER — ONDANSETRON HCL 4 MG/2ML IJ SOLN
4.0000 mg | Freq: Four times a day (QID) | INTRAMUSCULAR | Status: DC | PRN
  Administered 2020-01-09: 4 mg via INTRAVENOUS
  Filled 2020-01-09: qty 2

## 2020-01-09 MED ORDER — LACTATED RINGERS IV BOLUS
500.0000 mL | Freq: Once | INTRAVENOUS | Status: AC
  Administered 2020-01-09: 500 mL via INTRAVENOUS

## 2020-01-09 MED ORDER — CEPHALEXIN 500 MG PO CAPS
500.0000 mg | ORAL_CAPSULE | Freq: Once | ORAL | Status: AC
  Administered 2020-01-09: 500 mg via ORAL
  Filled 2020-01-09: qty 1

## 2020-01-09 MED ORDER — TERBUTALINE SULFATE 1 MG/ML IJ SOLN
0.2500 mg | Freq: Once | INTRAMUSCULAR | Status: AC
  Administered 2020-01-09: 0.25 mg via SUBCUTANEOUS

## 2020-01-09 MED ORDER — TERBUTALINE SULFATE 1 MG/ML IJ SOLN
INTRAMUSCULAR | Status: AC
  Filled 2020-01-09: qty 1

## 2020-01-09 MED ORDER — CEFAZOLIN SODIUM-DEXTROSE 2-4 GM/100ML-% IV SOLN
2.0000 g | Freq: Once | INTRAVENOUS | Status: AC
  Administered 2020-01-09: 2 g via INTRAVENOUS
  Filled 2020-01-09: qty 100

## 2020-01-09 MED ORDER — CEPHALEXIN 500 MG PO CAPS: 500.0000 mg | ORAL_CAPSULE | Freq: Three times a day (TID) | ORAL | 0 refills | Status: AC

## 2020-01-09 NOTE — Discharge Instructions (Signed)

## 2020-01-09 NOTE — Telephone Encounter (Signed)
Pt calling; pain in pelvic; didn't sleep last night; crying.  [phone]  Pt having pain in her pelvic area and back; tightening; had to stop to breathe through ctx; is having more than four an hour.  Adv to go to L&D via ED; not to drive herself; to call EMS if she has to.  Dondra Spry in L&D notified.

## 2020-01-09 NOTE — Final Progress Note (Signed)
Physician Final Progress Note  Patient ID: Janice Mccall MRN: [id number] DOB/AGE: [birth year] 29 y.o.  Admit date: 01/09/2020 Admitting provider: Homero Fellers, MD/ Jesus Genera. Danise Mina, CNM Discharge date: 01/12/2020   Admission Diagnoses: IUP at 32.4 weeks with abdominal pain, nausea and vomiting and preterm contractions. Pelvic pressure with urge to push Polyhydramnios  Discharge Diagnoses: IUP at 32.4 weeks Abdominal pain-resolved Nausea and vomiting resolved Probable UTI   Consults: None  Significant Findings/ Diagnostic Studies:  Janice Mccall is a 29 year old G2 P1001 BF at 32wk 4d gestational age who presented to L&D via EMS with c/o severe pelvic pressure (crying out in pain) and urge to push. She reported contraction pain and nausea and vomiting since last night.  She rates the contraction pain at a 10/10 on arrival. She vomited 3 times since 1930 last night after eating a steak taco and was only able to keep down water. Her cervix was 1/thick/posterior and high on arrival. Suleyma was given IV hydration, and terbutaline 0.25 mgm subcutaneously to help stop the contractions. After being given Zofran 4 mgm IV, she was able to tolerate clear liquids, then a regular diet. Her urinalysis returned with large leukocytes and many bacteria and she was given Ancef 2 Gms for possible UTI. Contractions spaced out and were no longer painful and cervix remained unchanged after 5 hours. FHR tracing was reactive. She requested discharge after her contractions were no linger painful.  She was discharged home on  Keflex 500 mgm po tid with urine culture pending.   Labs:   Collection Time: 01/09/20 12:33 PM  Result Value Ref Range   Tricyclic, Ur Screen NONE DETECTED NONE DETECTED   Amphetamines, Ur Screen NONE DETECTED NONE DETECTED   MDMA (Ecstasy)Ur Screen NONE DETECTED NONE DETECTED   Cocaine Metabolite,Ur [place] NONE DETECTED NONE DETECTED   Opiate, Ur Screen NONE DETECTED NONE  DETECTED   Phencyclidine (PCP) Ur S NONE DETECTED NONE DETECTED   Cannabinoid 50 Ng, Ur [place] NONE DETECTED NONE DETECTED   Barbiturates, Ur Screen NONE DETECTED NONE DETECTED   Benzodiazepine, Ur Scrn NONE DETECTED NONE DETECTED   Methadone Scn, Ur NONE DETECTED NONE DETECTED    Comment: (NOTE) Tricyclics + metabolites, urine    Cutoff 1000 ng/mL Amphetamines + metabolites, urine  Cutoff 1000 ng/mL MDMA (Ecstasy), urine              Cutoff 500 ng/mL Cocaine Metabolite, urine          Cutoff 300 ng/mL Opiate + metabolites, urine        Cutoff 300 ng/mL Phencyclidine (PCP), urine         Cutoff 25 ng/mL Cannabinoid, urine                 Cutoff 50 ng/mL Barbiturates + metabolites, urine  Cutoff 200 ng/mL Benzodiazepine, urine              Cutoff 200 ng/mL Methadone, urine                   Cutoff 300 ng/mL The urine drug screen provides only a preliminary, unconfirmed analytical test result and should not be used for non-medical purposes. Clinical consideration and professional judgment should be applied to any positive drug screen result due to possible interfering substances. A more specific alternate chemical method must be used in order to obtain a confirmed analytical result. Gas chromatography / mass spectrometry (GC/MS) is the preferred confirmat ory method. Performed at [facility]  [facility], [address] [place], [place] [address]                                           Urinalysis, Complete w Microscopic     Status: Abnormal   Collection Time: 01/09/20 12:47 PM  Result Value Ref Range   Color, Urine YELLOW (A) YELLOW   APPearance CLOUDY (A) CLEAR   Specific Gravity, Urine 1.009 1.005 - 1.030   pH 7.0 5.0 - 8.0   Glucose, UA NEGATIVE NEGATIVE mg/dL   Hgb urine dipstick NEGATIVE NEGATIVE   Bilirubin Urine NEGATIVE NEGATIVE   Ketones, ur NEGATIVE NEGATIVE mg/dL   Protein, ur NEGATIVE NEGATIVE mg/dL   Nitrite NEGATIVE NEGATIVE   Leukocytes,Ua LARGE (A)  NEGATIVE   RBC / HPF 0-5 0 - 5 RBC/hpf   WBC, UA 11-20 0 - 5 WBC/hpf   Bacteria, UA MANY (A) NONE SEEN   Squamous Epithelial / LPF 21-50 0 - 5   Mucus PRESENT     Comment: Performed at [facility], [address], [place], [place] [address]    Discharge Condition: stable  Disposition: Home Discharge disposition: 01-Home or Self Care       Diet: Regular diet  Discharge Activity: Activity as tolerated   Allergies as of 01/09/2020      Reactions   Prochlorperazine Swelling      Medication List    TAKE these medications   acetaminophen 500 MG tablet Commonly known as: TYLENOL Take 500 mg by mouth every 6 (six) hours as needed.   cephALEXin 500 MG capsule Commonly known as: KEFLEX Take 1 capsule (500 mg total) by mouth 3 (three) times daily for 7 days.   multivitamin-prenatal 27-0.8 MG Tabs tablet Take 1 tablet by mouth daily at 12 noon.        Total time spent taking care of this patient: 30 minutes  Signed: Farrel Conners 01/12/2020, 3:37 PM

## 2020-01-09 NOTE — Progress Notes (Signed)
OB History & Physical   History of Present Illness:  Chief Complaint:   HPI:  Janice Mccall is a 29 y.o. G68P1001 female with EDC=03/01/2020 at [redacted]w[redacted]d dated by her LMP =9wk5d [place].Marland Kitchen  Her pregnancy has been complicated by obesity (current BMI=45.41 kg/m2), syncope, subclinical hyperthyroidism, postpartum preeclampsia after her first delivery, PPD,  and polyhydramnios..  She presents to L&D via EMS with c/o severe pelvic pressure (crying out in pain) and urge to push. She reports contraction pain and nausea and vomiting since last night.  She rates the contraction pain at a 10/10 on arrival. She has vomited 3 times since 1930 last night after eating a steak taco. Has only been able to keep down water since then.  Denies vaginal bleeding or leakage of water. Baby moving OK This is the third time in in last 2-3 weeks that she presents to L&D. Seen 3/2 for abdominal pain and 3/12 for decreased fetal movement At the time of her last ultrasound 3/11 her AFI was 29.8 cm and baby was cephalic.  She was treated for an E.coli UTI 12/05/2019 Prenatal care site: Prenatal care at [facility] has also  been remarkable for a 20# weight gain and the following:  [facility] Prenatal Labs  Dating LMP = [redacted]w[redacted]d [place] Blood type: A/Positive/-- (10/09 1002)   Genetic Screen Declines Antibody:Negative (10/09 1002)  Anatomic [place] Normal anatomy, ant placenta, female gender Rubella: 16.60 (10/09 1002) Varicella: Immune  GTT Early:  105              Third trimester: 131 RPR: Non Reactive (10/09 1002)   Rhogam n/a HBsAg: Negative (10/09 1002)   TDaP vaccine      2019   Flu Shot: declines HIV: Non Reactive (10/09 1002)   Baby Food     Breast              GBS:   Contraception BTL consent signed [3/11] **needs to re-sign** Pap: will need postpartum  CBB  No   CS/VBAC n/a   Support Person           Maternal Medical History:   Past Medical History:  Diagnosis Date  . Obesity   . Postpartum depression 2019  .  Preeclampsia in postpartum period 2019   received magnesium sulfate  . Pregnancy induced hypertension   . Thyroid condition     Past Surgical History:  Procedure Laterality Date  . UMBILICAL HERNIA REPAIR      Allergies  Allergen Reactions  . Prochlorperazine Swelling    Prior to Admission medications   Medication Sig Start Date End Date Taking? Authorizing Provider  acetaminophen (TYLENOL) 500 MG tablet Take 500 mg by mouth every 6 (six) hours as needed.   Yes [provider]  Prenatal Vit-Fe Fumarate-FA (MULTIVITAMIN-PRENATAL) 27-0.8 MG TABS tablet Take 1 tablet by mouth daily at 12 noon.   Yes [provider]  prochlorperazine (COMPAZINE) 10 MG tablet Take 1 tablet (10 mg total) by mouth every 6 (six) hours as needed for nausea or vomiting. 08/17/19 08/20/19  Rexene Agent, CNM     Social History: She  reports that she has quit smoking. Her smoking use included cigarettes. She smoked 0.50 packs per day. She has never used smokeless tobacco. She reports that she does not drink alcohol or use drugs.  Family History: family history includes Hypertension in her maternal grandmother and paternal grandmother.   Review of Systems: Negative x 10 systems reviewed except as noted  in the HPI.      Physical Exam:  Vital Signs: BP (!) 110/59   Pulse 97   Temp 98.3 F  (Oral)   Resp 20   Ht 5\' 4"  (1.626 m)   Wt 120 kg   LMP 05/26/2019 (Exact Date)   BMI 45.41 kg/m  General: cries out with contraction pain intermittently HEENT: normocephalic, atraumatic Heart: regular rate & rhythm.  No murmurs/rubs/gallops Lungs: clear to auscultation bilaterally Abdomen: tender with contractions, contractions palpate moderate  Pelvic:   External: Normal external female genitalia  Cervix:   1/thick/posterior and high per RN exam Extremities:tender in shin area, non tender calves, no erythema   Neurologic: Alert & oriented x 3.   Baseline FHR: 135 with accelerations to  150s., moderate variability Toco: contractions every 11 minutes apart  Urinalysis significant for large leukocytes, 0-5 RBC, 11-20 WBC, many bacteria And 21-50 squamous cells Assessment:  Janice Mccall is a 29 y.o. G16P1001 female at [redacted]w[redacted]d with abdominal pain/ contractions and nausea/vomiting No cervical change since last check Possible UTI  Plan:  1. Admit to Labor & Delivery for observation  2. IV fluid bolus of 500 ml/hr, then 150 ml/hr 3. Zofran 4 mgm IV-can have clear liquids when nausea abates  4. Terbutaline 0.25 mgm [place] if hydration does not help painful contractions 5. Ancef 2 GMs IVPB 6. Urine culture 7. Continuous monitoring for contractions and FHTs.  [redacted]w[redacted]d  01/09/2020

## 2020-01-09 NOTE — Progress Notes (Signed)
Pt presents by EMS from home with c/o severe pelvic pressure and urge to push. Pt reports abd tightness and lower back pain started last night. Pt reports pain a 10/10 that comes and goes with CTX. Pt reports +FM. Denies LOF/bleeding. Pt reports N/V since last night. Pt reports she has vomited 3x since last pm. Has not kept anything down since then also. VSS stable. Monitors applied.

## 2020-01-11 LAB — URINE CULTURE

## 2020-01-11 LAB — OB RESULTS CONSOLE GC/CHLAMYDIA
Chlamydia: NEGATIVE
Gonorrhea: NEGATIVE

## 2020-01-13 ENCOUNTER — Other Ambulatory Visit: Payer: Self-pay | Admitting: Certified Nurse Midwife

## 2020-01-13 MED ORDER — CLOTRIMAZOLE-BETAMETHASONE 1-0.05 % EX CREA: 1.0000 "application " | TOPICAL_CREAM | Freq: Two times a day (BID) | CUTANEOUS | 0 refills | Status: DC | PRN

## 2020-01-13 MED ORDER — TERCONAZOLE 80 MG VA SUPP: 80.0000 mg | Freq: Every day | VAGINAL | 0 refills | Status: DC

## 2020-01-14 ENCOUNTER — Telehealth: Payer: Self-pay | Admitting: Obstetrics & Gynecology

## 2020-01-14 NOTE — Telephone Encounter (Signed)
Left message for pt to call back in regards to needing to get a new BTL form signed.

## 2020-01-18 ENCOUNTER — Ambulatory Visit (INDEPENDENT_AMBULATORY_CARE_PROVIDER_SITE_OTHER): Payer: Medicaid Other | Admitting: Obstetrics & Gynecology

## 2020-01-18 ENCOUNTER — Encounter: Payer: Self-pay | Admitting: Obstetrics & Gynecology

## 2020-01-18 ENCOUNTER — Ambulatory Visit (INDEPENDENT_AMBULATORY_CARE_PROVIDER_SITE_OTHER): Payer: Medicaid Other

## 2020-01-18 ENCOUNTER — Other Ambulatory Visit: Payer: Self-pay

## 2020-01-18 VITALS — BP 120/70 | Wt 260.0 lb

## 2020-01-18 DIAGNOSIS — O403XX0 Polyhydramnios, third trimester, not applicable or unspecified: Secondary | ICD-10-CM | POA: Diagnosis not present

## 2020-01-18 DIAGNOSIS — O0993 Supervision of high risk pregnancy, unspecified, third trimester: Secondary | ICD-10-CM

## 2020-01-18 DIAGNOSIS — Z3A33 33 weeks gestation of pregnancy: Secondary | ICD-10-CM | POA: Diagnosis not present

## 2020-01-18 DIAGNOSIS — O99213 Obesity complicating pregnancy, third trimester: Secondary | ICD-10-CM

## 2020-01-18 DIAGNOSIS — Z362 Encounter for other antenatal screening follow-up: Secondary | ICD-10-CM

## 2020-01-18 DIAGNOSIS — O0990 Supervision of high risk pregnancy, unspecified, unspecified trimester: Secondary | ICD-10-CM

## 2020-01-18 NOTE — Progress Notes (Signed)
  Subjective  Fetal Movement? yes Contractions? no Leaking Fluid? no Vaginal Bleeding? no Low back pain Objective  BP 120/70   Wt 260 lb (117.9 kg)   LMP 05/26/2019 (Exact Date)   BMI 44.63 kg/m  General: NAD Pumonary: no increased work of breathing Abdomen: gravid, non-tender Extremities: no edema Psychiatric: mood appropriate, affect full  Assessment  29 y.o. G2P1001 at [redacted]w[redacted]d by  03/01/2020, by Last Menstrual Period presenting for routine prenatal visit  Plan   Problem List Items Addressed This Visit    Supervision of high risk pregnancy, antepartum   Obesity affecting pregnancy, antepartum    ASA, APT, growth [place], diet   Polyhydramnios affecting pregnancy in third trimester   Relevant Orders   US OB Limited, nv   [redacted] weeks gestation of pregnancy       Review of ULTRASOUND.    I have personally reviewed images and report of recent ultrasound done at [facility].    Plan of management to be discussed with patient.    Polyhdramnios improved but still high/normal   Problem Noted Resolved   Polyhydramnios affecting pregnancy in third trimester     Syncope     Overview Signed 08/27/2019 12:31 PM by Kirby Funk, MD    Reports multiple episodes of syncope while at work Will obtain EKG; normal cbc and electrolytes Consider 24hr holter monitor Resolved as of 12/2019      Supervision of high risk pregnancy, antepartum      [facility] Prenatal Labs  Dating LMP = [redacted]w[redacted]d [place] Blood type: A/Positive/-- (10/09 1002)   Genetic Screen Declines Antibody:Negative (10/09 1002)  Anatomic [place] Normal anatomy, ant placenta, female gender Rubella: 16.60 (10/09 1002) Varicella: Immune  GTT Early:  105              Third trimester: 131 RPR: Non Reactive (10/09 1002)   Rhogam n/a HBsAg: Negative (10/09 1002)   TDaP vaccine      2019   Flu Shot: declines HIV: Non Reactive (10/09 1002)   Baby Food     Breast              GBS: nv  Contraception BTL consent signed [3/11] **needs to re-sign**  Pap: will need postpartum  CBB  No   CS/VBAC n/a   Support Person              PNV, FMC, PTL precautions  [place] nv (and APT weekly starting in 2 weeks)  Poly risks discussed - breech, abruption, PTL  Annamarie Major, MD, [facility], [facility] 01/18/2020  9:17 AM

## 2020-01-18 NOTE — Patient Instructions (Signed)
Braxton Hicks Contractions °Contractions of the uterus can occur throughout pregnancy, but they are not always a sign that you are in labor. You may have practice contractions called Braxton Hicks contractions. These false labor contractions are sometimes confused with true labor. °What are Braxton Hicks contractions? °Braxton Hicks contractions are tightening movements that occur in the muscles of the uterus before labor. Unlike true labor contractions, these contractions do not result in opening (dilation) and thinning of the cervix. Toward the end of pregnancy (32-34 weeks), Braxton Hicks contractions can happen more often and may become stronger. These contractions are sometimes difficult to tell apart from true labor because they can be very uncomfortable. You should not feel embarrassed if you go to the hospital with false labor. °Sometimes, the only way to tell if you are in true labor is for your health care provider to look for changes in the cervix. The health care provider will do a physical exam and may monitor your contractions. If you are not in true labor, the exam should show that your cervix is not dilating and your water has not broken. °If there are no other health problems associated with your pregnancy, it is completely safe for you to be sent home with false labor. You may continue to have Braxton Hicks contractions until you go into true labor. °How to tell the difference between true labor and false labor °True labor °· Contractions last 30-70 seconds. °· Contractions become very regular. °· Discomfort is usually felt in the top of the uterus, and it spreads to the lower abdomen and low back. °· Contractions do not go away with walking. °· Contractions usually become more intense and increase in frequency. °· The cervix dilates and gets thinner. °False labor °· Contractions are usually shorter and not as strong as true labor contractions. °· Contractions are usually irregular. °· Contractions  are often felt in the front of the lower abdomen and in the groin. °· Contractions may go away when you walk around or change positions while lying down. °· Contractions get weaker and are shorter-lasting as time goes on. °· The cervix usually does not dilate or become thin. °Follow these instructions at home: ° °· Take over-the-counter and prescription medicines only as told by your health care provider. °· Keep up with your usual exercises and follow other instructions from your health care provider. °· Eat and drink lightly if you think you are going into labor. °· If Braxton Hicks contractions are making you uncomfortable: °? Change your position from lying down or resting to walking, or change from walking to resting. °? Sit and rest in a tub of warm water. °? Drink enough fluid to keep your urine pale yellow. Dehydration may cause these contractions. °? Do slow and deep breathing several times an hour. °· Keep all follow-up prenatal visits as told by your health care provider. This is important. °Contact a health care provider if: °· You have a fever. °· You have continuous pain in your abdomen. °Get help right away if: °· Your contractions become stronger, more regular, and closer together. °· You have fluid leaking or gushing from your vagina. °· You pass blood-tinged mucus (bloody show). °· You have bleeding from your vagina. °· You have low back pain that you never had before. °· You feel your baby’s head pushing down and causing pelvic pressure. °· Your baby is not moving inside you as much as it used to. °Summary °· Contractions that occur before labor are   called Braxton Hicks contractions, false labor, or practice contractions. °· Braxton Hicks contractions are usually shorter, weaker, farther apart, and less regular than true labor contractions. True labor contractions usually become progressively stronger and regular, and they become more frequent. °· Manage discomfort from Braxton Hicks contractions  by changing position, resting in a warm bath, drinking plenty of water, or practicing deep breathing. °This information is not intended to replace advice given to you by your health care provider. Make sure you discuss any questions you have with your health care provider. °Document Revised: 09/23/2017 Document Reviewed: 02/24/2017 °Elsevier Patient Education © 2020 [employer] ° °

## 2020-01-23 ENCOUNTER — Telehealth: Payer: Self-pay

## 2020-01-23 NOTE — Telephone Encounter (Signed)
Pt left msg on triage line saying she is not feeling good (body is not). Started last night with a stomache, lower back, nausea, vomited once last night, has a headache of 9ish (from a scale 1 to 10). Denies fever, spotting/bleeding , decreased fetal movement or being in contact with someone covid POS. Advised if any of the last mentioned happened to go to labor and delivery. Also she can take up to 500 mg every 4-6 hrs as needed.

## 2020-01-24 ENCOUNTER — Observation Stay
Admission: AD | Admit: 2020-01-24 | Discharge: 2020-01-24 | Disposition: A | Discharge status: Home / Self Care | Payer: Medicaid Other | Attending: Obstetrics and Gynecology | Admitting: Obstetrics and Gynecology

## 2020-01-24 ENCOUNTER — Other Ambulatory Visit: Payer: Self-pay

## 2020-01-24 ENCOUNTER — Emergency Department: Payer: Medicaid Other

## 2020-01-24 ENCOUNTER — Emergency Department
Admission: EM | Admit: 2020-01-24 | Discharge: 2020-01-24 | Disposition: A | Discharge status: Home / Self Care | Payer: Medicaid Other | Source: Home / Self Care | Attending: Emergency Medicine | Admitting: Emergency Medicine

## 2020-01-24 ENCOUNTER — Encounter: Payer: Self-pay | Admitting: Obstetrics and Gynecology

## 2020-01-24 ENCOUNTER — Ambulatory Visit: Payer: Medicaid Other | Admitting: Certified Nurse Midwife

## 2020-01-24 ENCOUNTER — Encounter: Payer: Self-pay | Admitting: Emergency Medicine

## 2020-01-24 ENCOUNTER — Telehealth: Payer: Self-pay

## 2020-01-24 ENCOUNTER — Telehealth: Payer: Self-pay | Admitting: Certified Nurse Midwife

## 2020-01-24 DIAGNOSIS — Z888 Allergy status to other drugs, medicaments and biological substances status: Secondary | ICD-10-CM | POA: Insufficient documentation

## 2020-01-24 DIAGNOSIS — O99213 Obesity complicating pregnancy, third trimester: Secondary | ICD-10-CM | POA: Insufficient documentation

## 2020-01-24 DIAGNOSIS — R1030 Lower abdominal pain, unspecified: Secondary | ICD-10-CM | POA: Insufficient documentation

## 2020-01-24 DIAGNOSIS — Z3A37 37 weeks gestation of pregnancy: Secondary | ICD-10-CM | POA: Insufficient documentation

## 2020-01-24 DIAGNOSIS — Z8249 Family history of ischemic heart disease and other diseases of the circulatory system: Secondary | ICD-10-CM | POA: Diagnosis not present

## 2020-01-24 DIAGNOSIS — O26813 Pregnancy related exhaustion and fatigue, third trimester: Secondary | ICD-10-CM | POA: Insufficient documentation

## 2020-01-24 DIAGNOSIS — Z87891 Personal history of nicotine dependence: Secondary | ICD-10-CM | POA: Insufficient documentation

## 2020-01-24 DIAGNOSIS — Z331 Pregnant state, incidental: Secondary | ICD-10-CM

## 2020-01-24 DIAGNOSIS — Z6841 Body Mass Index (BMI) 40.0 and over, adult: Secondary | ICD-10-CM

## 2020-01-24 DIAGNOSIS — Z8759 Personal history of other complications of pregnancy, childbirth and the puerperium: Secondary | ICD-10-CM | POA: Diagnosis not present

## 2020-01-24 DIAGNOSIS — O99283 Endocrine, nutritional and metabolic diseases complicating pregnancy, third trimester: Secondary | ICD-10-CM | POA: Insufficient documentation

## 2020-01-24 DIAGNOSIS — R55 Syncope and collapse: Secondary | ICD-10-CM | POA: Insufficient documentation

## 2020-01-24 DIAGNOSIS — E0590 Thyrotoxicosis, unspecified without thyrotoxic crisis or storm: Secondary | ICD-10-CM | POA: Insufficient documentation

## 2020-01-24 DIAGNOSIS — Z20822 Contact with and (suspected) exposure to covid-19: Secondary | ICD-10-CM | POA: Insufficient documentation

## 2020-01-24 DIAGNOSIS — E669 Obesity, unspecified: Secondary | ICD-10-CM | POA: Diagnosis not present

## 2020-01-24 DIAGNOSIS — R4189 Other symptoms and signs involving cognitive functions and awareness: Secondary | ICD-10-CM | POA: Diagnosis present

## 2020-01-24 DIAGNOSIS — Z3A34 34 weeks gestation of pregnancy: Secondary | ICD-10-CM

## 2020-01-24 DIAGNOSIS — O403XX0 Polyhydramnios, third trimester, not applicable or unspecified: Secondary | ICD-10-CM | POA: Diagnosis present

## 2020-01-24 DIAGNOSIS — O99210 Obesity complicating pregnancy, unspecified trimester: Secondary | ICD-10-CM | POA: Diagnosis present

## 2020-01-24 DIAGNOSIS — Z3493 Encounter for supervision of normal pregnancy, unspecified, third trimester: Secondary | ICD-10-CM

## 2020-01-24 DIAGNOSIS — Z79899 Other long term (current) drug therapy: Secondary | ICD-10-CM | POA: Insufficient documentation

## 2020-01-24 DIAGNOSIS — O09293 Supervision of pregnancy with other poor reproductive or obstetric history, third trimester: Secondary | ICD-10-CM | POA: Diagnosis not present

## 2020-01-24 DIAGNOSIS — R42 Dizziness and giddiness: Secondary | ICD-10-CM

## 2020-01-24 DIAGNOSIS — O0990 Supervision of high risk pregnancy, unspecified, unspecified trimester: Secondary | ICD-10-CM

## 2020-01-24 DIAGNOSIS — O2693 Pregnancy related conditions, unspecified, third trimester: Secondary | ICD-10-CM | POA: Insufficient documentation

## 2020-01-24 DIAGNOSIS — O26893 Other specified pregnancy related conditions, third trimester: Secondary | ICD-10-CM

## 2020-01-24 LAB — COMPREHENSIVE METABOLIC PANEL
ALT: 28 U/L (ref 0–44)
AST: 31 U/L (ref 15–41)
Albumin: 2.8 g/dL — ABNORMAL LOW (ref 3.5–5.0)
Alkaline Phosphatase: 129 U/L — ABNORMAL HIGH (ref 38–126)
Anion gap: 9 (ref 5–15)
BUN: 5 mg/dL — ABNORMAL LOW (ref 6–20)
CO2: 20 mmol/L — ABNORMAL LOW (ref 22–32)
Calcium: 9.1 mg/dL (ref 8.9–10.3)
Chloride: 106 mmol/L (ref 98–111)
Creatinine, Ser: 0.45 mg/dL (ref 0.44–1.00)
GFR calc Af Amer: >60 mL/min (ref >60)
GFR calc non Af Amer: >60 mL/min (ref >60)
Glucose, Bld: 79 mg/dL (ref 70–99)
Potassium: 3.7 mmol/L (ref 3.5–5.1)
Sodium: 135 mmol/L (ref 135–145)
Total Bilirubin: 0.9 mg/dL (ref 0.3–1.2)
Total Protein: 6.8 g/dL (ref 6.5–8.1)

## 2020-01-24 LAB — CBC
HCT: 32.9 % — ABNORMAL LOW (ref 36.0–46.0)
Hemoglobin: 10.8 g/dL — ABNORMAL LOW (ref 12.0–15.0)
MCH: 31.3 pg (ref 26.0–34.0)
MCHC: 32.8 g/dL (ref 30.0–36.0)
MCV: 95.4 fL (ref 80.0–100.0)
Platelets: 148 10*3/uL — ABNORMAL LOW (ref 150–400)
RBC: 3.45 MIL/uL — ABNORMAL LOW (ref 3.87–5.11)
RDW: 13.7 % (ref 11.5–15.5)
WBC: 7.1 10*3/uL (ref 4.0–10.5)
nRBC: 0 % (ref 0.0–0.2)

## 2020-01-24 LAB — LIPASE, BLOOD: Lipase: 28 U/L (ref 11–51)

## 2020-01-24 LAB — URINE DRUG SCREEN, QUALITATIVE (ARMC ONLY)
Amphetamines, Ur Screen: NOT DETECTED
Barbiturates, Ur Screen: NOT DETECTED
Benzodiazepine, Ur Scrn: NOT DETECTED
Cannabinoid 50 Ng, Ur ~~LOC~~: NOT DETECTED
Cocaine Metabolite,Ur ~~LOC~~: NOT DETECTED
MDMA (Ecstasy)Ur Screen: NOT DETECTED
Methadone Scn, Ur: NOT DETECTED
Opiate, Ur Screen: NOT DETECTED
Phencyclidine (PCP) Ur S: NOT DETECTED
Tricyclic, Ur Screen: NOT DETECTED

## 2020-01-24 LAB — URINALYSIS, COMPLETE (UACMP) WITH MICROSCOPIC
Bilirubin Urine: NEGATIVE
Glucose, UA: NEGATIVE mg/dL
Hgb urine dipstick: NEGATIVE
Ketones, ur: NEGATIVE mg/dL
Leukocytes,Ua: NEGATIVE
Nitrite: NEGATIVE
Protein, ur: NEGATIVE mg/dL
Specific Gravity, Urine: 1.005 (ref 1.005–1.030)
pH: 7 (ref 5.0–8.0)

## 2020-01-24 LAB — GLUCOSE, CAPILLARY: Glucose-Capillary: 74 mg/dL (ref 70–99)

## 2020-01-24 LAB — TROPONIN I (HIGH SENSITIVITY): Troponin I (High Sensitivity): 4 ng/L (ref <18)

## 2020-01-24 LAB — RESPIRATORY PANEL BY RT PCR (FLU A&B, COVID)
Influenza A by PCR: NEGATIVE
Influenza B by PCR: NEGATIVE
SARS Coronavirus 2 by RT PCR: NEGATIVE

## 2020-01-24 MED ORDER — SODIUM CHLORIDE 0.9 % IV BOLUS
1000.0000 mL | Freq: Once | INTRAVENOUS | Status: AC
  Administered 2020-01-24: 14:00:00 1000 mL via INTRAVENOUS

## 2020-01-24 NOTE — ED Provider Notes (Signed)
[facility] Emergency Department Provider Note   ____________________________________________   First MD Initiated Contact with Patient 01/24/20 1213     (approximate)  I have reviewed the triage vital signs and the nursing notes.   HISTORY  Chief Complaint Loss of Consciousness  EM caveat: Some limitation due to patient not knowing exactly what happened, she reports that she does not recall what happened around time she passed out  HPI Janice Mccall is a 29 y.o. female here for evaluation after she passed out await room  Patient reports she is about [redacted] weeks pregnant.  She was at her OB/GYN's waiting room when she passed out.  She was seeing her doctor because she has been feeling lightheaded "she describes a dizzy feeling" for about the last day.  She also noticed yesterday that she passed a small amount of mucus in her vagina.  She been having some occasional slight crampy discomfort in her lower abdomen since then as well  She reports it does not feel like contractions.  She denies any chest pain or shortness of breath.  No recent illness no fevers or chills.  No nausea or vomiting.  No known Covid exposure  Denies headache.  No numbness or weakness in any one body part.   No seizure activity was reported  Past Medical History:  Diagnosis Date  . Obesity   . Postpartum depression 2019  . Preeclampsia in postpartum period 2019   received magnesium sulfate  . Pregnancy induced hypertension   . Thyroid condition     Patient Active Problem List   Diagnosis Date Noted  . Decreased fetal movement 01/04/2020  . Polyhydramnios affecting pregnancy in third trimester 01/03/2020  . Abdominal pain in pregnancy, third trimester 12/05/2019  . Dizzy 10/23/2019  . Syncope 08/27/2019  . Supervision of high risk pregnancy, antepartum 07/25/2019  . Obesity affecting pregnancy, antepartum 07/25/2019  . BMI 40.0-44.9, adult (HCC) 07/25/2019  .  Hyperprolactinemia (HCC) 12/02/2017  . Subclinical hyperthyroidism 09/07/2017    Past Surgical History:  Procedure Laterality Date  . UMBILICAL HERNIA REPAIR      Prior to Admission medications   Medication Sig Start Date End Date Taking? Authorizing Provider  acetaminophen (TYLENOL) 500 MG tablet Take 500 mg by mouth every 6 (six) hours as needed.    [provider]  clotrimazole-betamethasone (LOTRISONE) cream Apply 1 application topically 2 (two) times daily as needed. For itching 01/13/20   Farrel Conners, CNM  Prenatal Vit-Fe Fumarate-FA (MULTIVITAMIN-PRENATAL) 27-0.8 MG TABS tablet Take 1 tablet by mouth daily at 12 noon.    [provider]  terconazole (TERAZOL 3) 80 MG vaginal suppository Place 1 suppository (80 mg total) vaginally at bedtime. 01/13/20   Farrel Conners, CNM  prochlorperazine (COMPAZINE) 10 MG tablet Take 1 tablet (10 mg total) by mouth every 6 (six) hours as needed for nausea or vomiting. 08/17/19 08/20/19  Oswaldo Conroy, CNM    Allergies Prochlorperazine  Family History  Problem Relation Age of Onset  . Hypertension Maternal Grandmother   . Hypertension Paternal Grandmother     Social History Social History   Tobacco Use  . Smoking status: Former Smoker    Packs/day: 0.50    Types: Cigarettes  . Smokeless tobacco: Never Used  Substance Use Topics  . Alcohol use: No  . Drug use: Never    Review of Systems Constitutional: No fever/chills Eyes: No visual changes. ENT: No sore throat.  No neck pain. Cardiovascular: Denies chest pain.  Respiratory: Denies shortness of breath. Gastrointestinal: See HPI Genitourinary: See HPI Musculoskeletal: Negative for back pain. Skin: Negative for rash. Neurological: Negative for headaches, areas of focal weakness or numbness.  Feels "dizzy" especially when she sits up or walks    ____________________________________________   PHYSICAL EXAM:  VITAL SIGNS: ED Triage Vitals    Enc Vitals Group     BP 01/24/20 1211 (!) 110/56     Pulse Rate 01/24/20 1211 84     Resp 01/24/20 1211 17     Temp 01/24/20 1211 98.9 F (37.2 C)     Temp Source 01/24/20 1211 Axillary     SpO2 01/24/20 1211 96 %     Weight 01/24/20 1213 276 lb 9.6 oz (125.5 kg)     Height --      Head Circumference --      Peak Flow --      Pain Score 01/24/20 1212 10     Pain Loc --      Pain Edu? --      Excl. in [place]? --     Constitutional: Alert and oriented.  Slightly somnolent appearance.  Appears mildly ill but in no acute distress.  Fully oriented Eyes: Conjunctivae are normal. Head: Atraumatic. Nose: No congestion/rhinnorhea. Mouth/Throat: Mucous membranes are moist. Neck: No stridor.  Cardiovascular: Normal rate, regular rhythm. Grossly normal heart sounds.  Good peripheral circulation. Respiratory: Normal respiratory effort.  No retractions. Lungs CTAB. Gastrointestinal: Soft and nontender. No distention.  Very gravid.  Laying on her left side.  Fetal heart tones are normal Pelvic: Escorted by nurse Vicente Males Musculoskeletal: No lower extremity tenderness nor edema. Neurologic:  Normal speech and language. No gross focal neurologic deficits are appreciated.  Skin:  Skin is warm, dry and intact. No rash noted. Psychiatric: Mood and affect are normal. Speech and behavior are normal.  She does not have obvious stigmata of preeclampsia such as edema or hypertension.  There is been no report of seizure-like activity ____________________________________________   LABS (all labs ordered are listed, but only abnormal results are displayed)  Labs Reviewed  CBC - Abnormal; Notable for the following components:      Result Value   RBC 3.45 (*)    Hemoglobin 10.8 (*)    HCT 32.9 (*)    Platelets 148 (*)    All other components within normal limits  COMPREHENSIVE METABOLIC PANEL - Abnormal; Notable for the following components:   CO2 20 (*)    BUN 5 (*)    Albumin 2.8 (*)    Alkaline  Phosphatase 129 (*)    All other components within normal limits  RESPIRATORY PANEL BY RT PCR (FLU A&B, COVID)  LIPASE, BLOOD  GLUCOSE, CAPILLARY  URINALYSIS, COMPLETE (UACMP) WITH MICROSCOPIC  URINE DRUG SCREEN, QUALITATIVE ([facility] ONLY)  CBG MONITORING, ED  TROPONIN I (HIGH SENSITIVITY)   ____________________________________________  EKG  Reviewed interpreted me at 1205 Heart rate 90 QRS 69 QTc 430 Normal sinus rhythm, no evidence of acute ischemia or ectopy   ____________________________________________  RADIOLOGY  CT Head Wo Contrast  Result Date: 01/24/2020 CLINICAL DATA:  Dizziness with syncope, pregnant, evaluate for ICH; syncope, simple, abnormal neuro exam. Additional history provided: History of preeclampsia, patient reports severe headache, nausea, back pain and abdominal pain. EXAM: CT HEAD WITHOUT CONTRAST TECHNIQUE: Contiguous axial images were obtained from the base of the skull through the vertex without intravenous contrast. COMPARISON:  No pertinent prior studies available for comparison. FINDINGS: Brain: There is no evidence of  acute intracranial hemorrhage, intracranial mass, midline shift or extra-axial fluid collection.No demarcated cortical infarction. Vascular: No hyperdense vessel. Skull: Normal. Negative for fracture or focal lesion. Sinuses/Orbits: Mild ethmoid sinus mucosal thickening. IMPRESSION: Unremarkable non-contrast CT appearance of the brain. No evidence of acute intracranial abnormality. Electronically Signed   By: Jackey Loge DO   On: 01/24/2020 13:33    CT head negative for acute ____________________________________________   PROCEDURES  Procedure(s) performed: None  Procedures  Critical Care performed: No  ____________________________________________   INITIAL IMPRESSION / ASSESSMENT AND PLAN / ED COURSE  Pertinent labs & imaging results that were available during my care of the patient were reviewed by me and considered in my  medical decision making (see chart for details).   Patient presents after feeling lightheaded have a syncopal episode at [facility] office.  Reassuring cardiac examination without chest pain or shortness of breath.  Reassuring neurologic exam though she does report feeling of dizziness throughout the preceding 24 hours.  Head CT negative for acute cause, no focality on neurologic exam  Pelvic exam does not demonstrate evidence to suggest acute impending delivery.  Clinical Course as of Jan 23 1425  Thu Jan 24, 2020  1241 Discussed case with Dr. Jean Rosenthal.  Agrees with current work-up.  If ED work-up does not show immediate concern or life threat, plan to transfer to labor and delivery to be seen and likely admitted for observation.  Plan to do digital exam at this time   [MQ]  1252 Sterile digital examination of the pelvis performed.  I was not able to palpate the cervical os, the vaginal vault appears empty and there is no evidence of impending labor or delivery at this time.  No crowning.   [MQ]  1411 Patient alertness improved.  Fully alert.  Resting in no distress.  Normotensive at this time.  Reports she feels like she would like to try to get up, nurse assisting her to utilize the bathroom.  Denies pelvic pressure at this time reports she feels she needs to urinate.  Nurse at bedside, have paged [facility] to discuss further management anticipating transfer to labor and delivery for their assessment   [MQ]  1420 Dr. Annamarie Major advises transfer to labor and delivery for consult with Consuella Lose   [MQ]    Clinical Course User Index [MQ] Sharyn Creamer, MD    Vitals:   01/24/20 1230 01/24/20 1400  BP: (!) 91/45 125/67  Pulse: 89 89  Resp: 19 18  Temp:    SpO2: 98% 99%    Discussed discharge with the patient who is fully alert oriented ambulatory now.  She appears improved.  She is being brought directly by [place] to our labor and delivery unit for further work-up.  Patient understanding this  plan.  Discussed with Dr. Thomasene Mohair who is willing to accept the patient to labor and delivery for evaluation today ____________________________________________   FINAL CLINICAL IMPRESSION(S) / ED DIAGNOSES  Final diagnoses:  Syncope and collapse  Lightheaded  Third trimester pregnancy        Note:  This document was prepared using Dragon voice recognition software and may include unintentional dictation errors       Sharyn Creamer, MD 01/24/20 1429

## 2020-01-24 NOTE — ED Triage Notes (Signed)
Patient presents to the ED via EMS from [facility] via [facility].  Per EMS staff, patient was in the waiting room of [facility] and staff noticed that patient slumped down on the couch and had passed out.  Per EMS, patient was difficult to arouse and had a difficult time moving to the stretcher.  On arrival patient is very lethargic but answers questions appropriately when aroused.  Patient  Is 34 weeks 5 days pregnant with history of pre-eclampsia and polyhydramnios.  Patient is complaining of severe headache, nausea, back pain and abdominal pain.  Patient reports abdominal cramping similar to past labor.  Patient states abdominal cramping began yesterday.  Patient reports white mucous discharge yesterday as well.
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
## 2020-01-24 NOTE — Telephone Encounter (Signed)
Pt called triage reporting Cramps strong last night feels like she needs to urinate but cant , has took tylenol no relief for the H/a and reports dizziness, has been drinking plenty of water, pt feels off, pt schedule with clg today at 10:50

## 2020-01-24 NOTE — Discharge Instructions (Signed)
If you feel dizzy again, please call your Doctor and come to the hospital.  If you are still conscious, go to [facility] ER and ask to be transported to Labor and Delivery.  This has been discussed and approved by Dr. Leatha Gilding, MFM at [facility].  This would facilitate cardiac and neurologic workup that could underlie symptoms.

## 2020-01-24 NOTE — Telephone Encounter (Signed)
Janice Mccall, now 34wk5d pregnant, was sitting in the waiting room waiting to be called back for her appointment and did not respond to having her name called. She was unresponsive to shaking arm. She had a pulse, but was unconscious. She was moved to a lying position on her right side and almost immediately awoke but was confused and complained of a headache.  BP 100/50 with her on her side. CBG was 125. (She stated she had a bagel for breakfast). FHT was 150s. 911 was called when finding her unresponsive and they arrived on the scene. Pulse was in the 80s and 90s She would go in and out of consciousness, and would not always respond to their questions. Cardiac monitor was applied and the EMTs took her to [facility] ER.  Dr Jean Rosenthal was advised of the incident.   Farrel Conners, CNM

## 2020-01-24 NOTE — Discharge Instructions (Addendum)
Being discharged from the emergency room, but we will be bringing it directly to our labor and delivery unit for further evaluation with Dr. Jean Rosenthal of [facility].

## 2020-01-24 NOTE — Discharge Summary (Signed)
See final progress note. 

## 2020-01-24 NOTE — ED Notes (Signed)
Pt trx to CT.  

## 2020-01-24 NOTE — Progress Notes (Signed)
Patient arrived on stretcher from ED after evaluation for a syncope episode in the [facility] office. Patient states she felt dizzy at home and nothing helped including lying on her side. She went into [place] office and while there she was already lying back and had syncope episode. She did not fall. Patient was brought by EMS to ED, labs done and reviewed. See results review. I communicated all of the labs including bacteremia and Hgb results to Dr. Jean Rosenthal in person. I arrived patient into the room and completed triage assessment. Patient was calm and collected through assessment. I left the room to page Dr. Jean Rosenthal and a few minutes later at 1536, the patient called out screaming. I immediately went into the room and the patient was sitting on the side of the bed crying and screaming that she "can't take it anymore". When I asked her what she meant she said she wanted to go home now. I told her that I had just paged Dr. Jean Rosenthal and she stated that she wanted to discharge herself. She then stated that she can't take being pregnant anymore, that she doesn't know how she can make it to full term. I asked clarifying questions if she had hoped baby would come today and she said no and that she didn't want baby to be born preterm. At this time Dr. Jean Rosenthal walked by in person. The patient told Dr. Jean Rosenthal and I that her husband is not supportive and "doesn't understand her being pregnant" and that she feels like she's doing the pregnancy alone because he doesn't want to come up to the hospital and has told her that he isn't coming for the birth. Both Dr. Jean Rosenthal and I asked her very specifically in multiple ways if she feels unsafe, threatened at home, or is being physically, emotionally, or sexually abused and she denied any abuse and says she feels safe. She also said that her 27 year old daughter is staying with her mom in [place] since last week for the rest of her pregnancy and that her husband cooks for her and does  chores so she doesn't have to stand long, she just doesn't feel like he's emotionally supportive about how difficult pregnancy is. Dr. Jean Rosenthal personally assessed the patient physically and gave education about syncopal episodes and the patient promised she will come back to the hospital if she feels dizzy again. Patient is now calm and getting dressed for discharge.

## 2020-01-31 ENCOUNTER — Telehealth: Payer: Self-pay

## 2020-01-31 NOTE — Telephone Encounter (Signed)
Patient reports SDJ told her last week at [facility] to notify him if she had any swelling. She is having swelling in her legs and feet. She's not in [place]. Inquiring what to do. [phone]#[phone]

## 2020-01-31 NOTE — Telephone Encounter (Signed)
Spoke w/patient. Relayed Dr. Edison Pace response that pt would need to go to [facility] for evaluation/management. Patient understands, but states she doesn't have a ride. She plans to wait and be seen at her apt here tomorrow. I advised if her symptoms increase or she just isn't feeling herself, she should call 911 to be transported to [facility]. Patient verbalizes understanding.

## 2020-01-31 NOTE — Telephone Encounter (Signed)
As I instructed her at the hospital after consultation with [facility], if she has concerning symptoms, I recommend she go straight to [facility] ER/L&D for further evaluation.  There are concerns regarding cardiac and/or neurologic issues.  An abrupt increase in swelling could indicate a cardiac issue.

## 2020-01-31 NOTE — Telephone Encounter (Signed)
LMVM TRC. DPR instructs ok to leave detailed message but the phone # is different than above and disconnected. LM to notify to contact office and wait for front desk to answer. Ask for clinical personnel to relay SDJ response.

## 2020-02-01 ENCOUNTER — Encounter: Payer: Medicaid Other | Admitting: Obstetrics & Gynecology

## 2020-02-01 ENCOUNTER — Other Ambulatory Visit: Payer: Medicaid Other

## 2020-02-01 NOTE — Telephone Encounter (Addendum)
Patient had to r/s apt for today. Reports her feet are swelling bigger. Inquiring what to do. GZ#[phone]

## 2020-02-01 NOTE — Telephone Encounter (Signed)
Spoke w/patient. Advised per SDJ instructions she needs to go to [facility] for evaluation. Inquired if she had a ride. She states she'll figure it out. Advised to contact EMS if needed for transport.

## 2020-02-01 NOTE — Telephone Encounter (Signed)
This encounter was created in error - please disregard.

## 2020-02-06 ENCOUNTER — Ambulatory Visit (INDEPENDENT_AMBULATORY_CARE_PROVIDER_SITE_OTHER): Payer: Medicaid Other | Admitting: Certified Nurse Midwife

## 2020-02-06 ENCOUNTER — Other Ambulatory Visit: Payer: Self-pay

## 2020-02-06 ENCOUNTER — Ambulatory Visit (INDEPENDENT_AMBULATORY_CARE_PROVIDER_SITE_OTHER): Payer: Medicaid Other

## 2020-02-06 VITALS — BP 128/84 | Wt 268.0 lb

## 2020-02-06 DIAGNOSIS — O0993 Supervision of high risk pregnancy, unspecified, third trimester: Secondary | ICD-10-CM

## 2020-02-06 DIAGNOSIS — Z113 Encounter for screening for infections with a predominantly sexual mode of transmission: Secondary | ICD-10-CM | POA: Diagnosis not present

## 2020-02-06 DIAGNOSIS — O99213 Obesity complicating pregnancy, third trimester: Secondary | ICD-10-CM

## 2020-02-06 DIAGNOSIS — Z3A36 36 weeks gestation of pregnancy: Secondary | ICD-10-CM

## 2020-02-06 DIAGNOSIS — O09293 Supervision of pregnancy with other poor reproductive or obstetric history, third trimester: Secondary | ICD-10-CM | POA: Diagnosis not present

## 2020-02-06 DIAGNOSIS — O0990 Supervision of high risk pregnancy, unspecified, unspecified trimester: Secondary | ICD-10-CM

## 2020-02-06 DIAGNOSIS — Z3685 Encounter for antenatal screening for Streptococcus B: Secondary | ICD-10-CM

## 2020-02-06 DIAGNOSIS — O403XX0 Polyhydramnios, third trimester, not applicable or unspecified: Secondary | ICD-10-CM | POA: Diagnosis not present

## 2020-02-06 LAB — FETAL NONSTRESS TEST

## 2020-02-06 NOTE — Progress Notes (Signed)
No vb. No lof. Pt having a lot of pressure and pain. Pt wondering about IOL.

## 2020-02-08 ENCOUNTER — Other Ambulatory Visit
Admission: RE | Admit: 2020-02-08 | Discharge: 2020-02-08 | Disposition: A | Discharge status: Home / Self Care | Payer: Medicaid Other | Source: Ambulatory Visit | Attending: Certified Nurse Midwife | Admitting: Certified Nurse Midwife

## 2020-02-08 DIAGNOSIS — Z01812 Encounter for preprocedural laboratory examination: Secondary | ICD-10-CM | POA: Insufficient documentation

## 2020-02-08 DIAGNOSIS — Z20822 Contact with and (suspected) exposure to covid-19: Secondary | ICD-10-CM | POA: Insufficient documentation

## 2020-02-08 LAB — SARS CORONAVIRUS 2 (TAT 6-24 HRS): SARS Coronavirus 2: NEGATIVE

## 2020-02-09 LAB — CHLAMYDIA/GONOCOCCUS/TRICHOMONAS, NAA
Chlamydia by NAA: NEGATIVE
Gonococcus by NAA: NEGATIVE
Trich vag by NAA: NEGATIVE

## 2020-02-10 LAB — CULTURE, BETA STREP (GROUP B ONLY): Strep Gp B Culture: NEGATIVE

## 2020-02-10 NOTE — Progress Notes (Signed)
ROB/ AFI/ NST at 36wk4d: This is patient's first visit since her syncopal episode 4/1 while sitting waiting for her ROB visit in the waiting room. She had a negative head CT and vital signs and NST were reactive. She signed out basically AMA prior to a cardiac and neurology work up could be done. She is high risk also for polyhydramnios, and for BMI>40. DR Jean Rosenthal spoke with DR Leatha Gilding on 4/1 and she recommended induction at 37 weeks  Exam: upon entering exam room, patient was asleep and aroused after shaking arm (did not respond to calling her name) She states that she is not able to get comfortable to sleep for any length of time.  NST: baseline 145 with accelerations to 170, moderate variability BP: 128/84 Wt gain of 8# in the last 3 weeks AFI: 28.5 cm/ cephalic presentation Cervix: closed/ long/posterior  A: IUP at 36 wk 4 d with reactive NST Polyhydramnios Morbid obesity Syncopal episodes  P: Discussed IOL with patient, methods used. GBS and Aptima today Scheduled for 02/11/2020 at 0800 To have Covid 19 test done 4/16 at [facility] Labor precautions  Farrel Conners, CNM

## 2020-02-10 NOTE — H&P (Signed)
OB History & Physical   History of Present Illness:  Chief Complaint:  Presents for a NST and to discuss an induction of labor. HPI:  Janice Mccall is a 29 y.o. G93P1001 female with EDC=03/01/2020 at 36wk4d dated by LMP=9wk5d ultrasound.  Her pregnancy has been complicated by morbid obesity, polyhydramnios and syncopal episodes/ loss of consciousness, and a history of postpartum preeclampsia and postpartum depression.  Dr Janice Mccall, DP has recommended IOL at 37 weeks after her last syncopal episode 4/1.  She presents for an NST/AFI and to discuss an induction of labor.  She complains of worsening pelvic pain and pressure and irregular contractions. Denies leakage of water and vaginal bleeding. Baby moving well.  Prenatal care site: Prenatal care at [facility] has been remarkable for   Clinic [facility] Prenatal Labs  Dating LMP = [redacted]w[redacted]d [place] Blood type: A/Positive/-- (10/09 1002)   Genetic Screen Declines Antibody:Negative (10/09 1002)  Anatomic [place] Normal anatomy, ant placenta, female gender Rubella: 16.60 (10/09 1002) Varicella: Immune  GTT Early:  105              Third trimester: 131 RPR: Non Reactive (10/09 1002)   Rhogam n/a HBsAg: Negative (10/09 1002)   TDaP vaccine      2019   Flu Shot: declines HIV: Non Reactive (10/09 1002)   Baby Food     Breast              GBS: negative  Contraception BTL consent signed [3/11] **needs to re-sign** Pap: will need postpartum  CBB  No   CS/VBAC n/a   Support Person     OB History  Gravida Para Term Preterm AB Living  2 1 1     1   SAB TAB Ectopic Multiple Live Births          1    # Outcome Date GA Lbr Len/2nd Weight Sex Delivery Anes PTL Lv  2 Current           1 Term 12/30/17 [redacted]w[redacted]d  6 lb 2.2 oz (2.785 kg) F Vag-Spont   LIV     Complications: Preeclampsia       Maternal Medical History:   Past Medical History:  Diagnosis Date  . Obesity   . Postpartum depression 2019  . Preeclampsia in postpartum period 2019   received  magnesium sulfate  . Pregnancy induced hypertension   . Thyroid condition     Past Surgical History:  Procedure Laterality Date  . UMBILICAL HERNIA REPAIR      Allergies  Allergen Reactions  . Prochlorperazine Swelling    Prior to Admission medications   Medication Sig Start Date End Date Taking? Authorizing Provider  acetaminophen (TYLENOL) 500 MG tablet Take 500 mg by mouth every 6 (six) hours as needed.    [provider]  clotrimazole-betamethasone (LOTRISONE) cream Apply 1 application topically 2 (two) times daily as needed. For itching 01/13/20   01/15/20, CNM  Prenatal Vit-Fe Fumarate-FA (MULTIVITAMIN-PRENATAL) 27-0.8 MG TABS tablet Take 1 tablet by mouth daily at 12 noon.    [provider]  prochlorperazine (COMPAZINE) 10 MG tablet Take 1 tablet (10 mg total) by mouth every 6 (six) hours as needed for nausea or vomiting. 08/17/19 08/20/19  08/22/19, CNM          Social History: She  reports that she has quit smoking. Her smoking use included cigarettes. She smoked 0.50 packs per day. She has never used smokeless tobacco. She reports  that she does not drink alcohol or use drugs.  Family History: family history includes Hypertension in her maternal grandmother and paternal grandmother.   Review of Systems: Negative x 10 systems reviewed except as noted in the HPI.      Physical Exam:  Vital Signs: BP 128/84   Wt 268 lb (121.6 kg)   LMP 05/26/2019 (Exact Date)   BMI 46.00 kg/m  General: gravid BF in no acute distress.  HEENT: normocephalic, atraumatic Heart: regular rate & rhythm.  No murmurs/rubs/gallops Lungs: clear to auscultation bilaterally Abdomen: soft, gravid, non-tender;  EFW: 7# (based upon a EFW of 5#9oz from 01/18/2020 ultrasound) Pelvic:   External: Normal external female genitalia  Cervix:L/C/ -2 to -3/ soft/posterior  Extremities: non-tender, symmetric, trace edema bilaterally.  DTRs: +1  Neurologic: Alert &  oriented x 3.   Baseline FHR: 145 baseline with accelerations to 170s, moderate variability  Ultrasound: cephalic/ 62.8MN AFT   Assessment:  Janice Mccall is a 29 y.o. G96P1001 female at 12wk4d with reactive NST and polyhydramnios Hx of syncopal episodes/ loss of consciousness Morbid obesity  Plan:  1. Scheduled for IOL on 02/11/2020. Explained methods of induction and possible need for serial induction due to low Bishop score. 2. Covid 19 test to be done 4/16 at [place]. 3. GBS and Aptima done today  4. Breast 5. A POS/ RI/ VI 6. Last TDAP 2019  7. Desires BTL ( 30 day papers originally signed 3/11 and resigned 3/26)  Janice Mccall , CNM

## 2020-02-10 NOTE — Addendum Note (Signed)
Addended by: Farrel Conners on: 02/10/2020 03:21 PM   Modules accepted: Orders, SmartSet

## 2020-02-11 ENCOUNTER — Inpatient Hospital Stay
Admission: RE | Admit: 2020-02-11 | Discharge: 2020-02-14 | DRG: 807 | Disposition: A | Discharge status: Home / Self Care | Payer: Medicaid Other | Attending: Obstetrics & Gynecology | Admitting: Obstetrics & Gynecology

## 2020-02-11 ENCOUNTER — Other Ambulatory Visit: Payer: Self-pay

## 2020-02-11 ENCOUNTER — Encounter: Payer: Self-pay | Admitting: Obstetrics and Gynecology

## 2020-02-11 DIAGNOSIS — Z3490 Encounter for supervision of normal pregnancy, unspecified, unspecified trimester: Secondary | ICD-10-CM | POA: Diagnosis present

## 2020-02-11 DIAGNOSIS — Z87891 Personal history of nicotine dependence: Secondary | ICD-10-CM

## 2020-02-11 DIAGNOSIS — F53 Postpartum depression: Secondary | ICD-10-CM | POA: Diagnosis present

## 2020-02-11 DIAGNOSIS — O403XX0 Polyhydramnios, third trimester, not applicable or unspecified: Principal | ICD-10-CM | POA: Diagnosis present

## 2020-02-11 DIAGNOSIS — O99345 Other mental disorders complicating the puerperium: Secondary | ICD-10-CM | POA: Diagnosis present

## 2020-02-11 DIAGNOSIS — Z3A37 37 weeks gestation of pregnancy: Secondary | ICD-10-CM

## 2020-02-11 DIAGNOSIS — O0990 Supervision of high risk pregnancy, unspecified, unspecified trimester: Secondary | ICD-10-CM

## 2020-02-11 LAB — ABO/RH: ABO/RH(D): A POS

## 2020-02-11 LAB — CBC
HCT: 35.1 % — ABNORMAL LOW (ref 36.0–46.0)
Hemoglobin: 11.3 g/dL — ABNORMAL LOW (ref 12.0–15.0)
MCH: 30.5 pg (ref 26.0–34.0)
MCHC: 32.2 g/dL (ref 30.0–36.0)
MCV: 94.9 fL (ref 80.0–100.0)
Platelets: 177 10*3/uL (ref 150–400)
RBC: 3.7 MIL/uL — ABNORMAL LOW (ref 3.87–5.11)
RDW: 13.7 % (ref 11.5–15.5)
WBC: 6.3 10*3/uL (ref 4.0–10.5)
nRBC: 0 % (ref 0.0–0.2)

## 2020-02-11 LAB — TYPE AND SCREEN
ABO/RH(D): A POS
Antibody Screen: NEGATIVE

## 2020-02-11 MED ORDER — OXYTOCIN 10 UNIT/ML IJ SOLN
INTRAMUSCULAR | Status: AC
  Filled 2020-02-11: qty 2

## 2020-02-11 MED ORDER — OXYTOCIN 40 UNITS IN NORMAL SALINE INFUSION - SIMPLE MED
2.5000 [IU]/h | INTRAVENOUS | Status: DC
  Administered 2020-02-12: 2.5 [IU]/h via INTRAVENOUS
  Filled 2020-02-11: qty 1000

## 2020-02-11 MED ORDER — LACTATED RINGERS IV SOLN: INTRAVENOUS | Status: DC

## 2020-02-11 MED ORDER — LIDOCAINE HCL (PF) 1 % IJ SOLN: 30.0000 mL | INTRAMUSCULAR | Status: DC | PRN

## 2020-02-11 MED ORDER — MISOPROSTOL 25 MCG QUARTER TABLET
25.0000 ug | ORAL_TABLET | Freq: Once | ORAL | Status: AC
  Administered 2020-02-11: 25 ug via BUCCAL

## 2020-02-11 MED ORDER — LACTATED RINGERS IV SOLN
500.0000 mL | INTRAVENOUS | Status: DC | PRN
  Administered 2020-02-12: 500 mL via INTRAVENOUS

## 2020-02-11 MED ORDER — AMMONIA AROMATIC IN INHA
RESPIRATORY_TRACT | Status: AC
  Filled 2020-02-11: qty 10

## 2020-02-11 MED ORDER — MISOPROSTOL 25 MCG QUARTER TABLET
25.0000 ug | ORAL_TABLET | ORAL | Status: DC | PRN
  Administered 2020-02-11 (×2): 25 ug via VAGINAL
  Filled 2020-02-11 (×4): qty 1

## 2020-02-11 MED ORDER — LIDOCAINE HCL (PF) 1 % IJ SOLN
INTRAMUSCULAR | Status: AC
  Filled 2020-02-11: qty 30

## 2020-02-11 MED ORDER — TERBUTALINE SULFATE 1 MG/ML IJ SOLN: 0.2500 mg | Freq: Once | INTRAMUSCULAR | Status: DC | PRN

## 2020-02-11 MED ORDER — MISOPROSTOL 200 MCG PO TABS
ORAL_TABLET | ORAL | Status: AC
  Filled 2020-02-11: qty 4

## 2020-02-11 MED ORDER — OXYTOCIN 40 UNITS IN NORMAL SALINE INFUSION - SIMPLE MED
1.0000 m[IU]/min | INTRAVENOUS | Status: DC
  Administered 2020-02-11: 1 m[IU]/min via INTRAVENOUS

## 2020-02-11 MED ORDER — OXYTOCIN BOLUS FROM INFUSION
500.0000 mL | Freq: Once | INTRAVENOUS | Status: AC
  Administered 2020-02-12: 500 mL via INTRAVENOUS

## 2020-02-11 MED ORDER — ONDANSETRON HCL 4 MG/2ML IJ SOLN: 4.0000 mg | Freq: Four times a day (QID) | INTRAMUSCULAR | Status: DC | PRN

## 2020-02-11 NOTE — H&P (Signed)
History and Physical Interval Note:  02/11/2020 10:38 AM  Janice Mccall  has presented today for INDUCTION OF LABOR (cervical ripening agents),  with the diagnosis of polyhydramnios, syncopal episodes, history of postpartum preeclampsia, postpartum depression. The various methods of treatment have been discussed with the patient and family. After consideration of risks, benefits and other options for treatment, the patient has consented to  Labor induction .  The patient's history has been reviewed, patient examined, no change in status, and is stable for induction as planned.  See H&P. I have reviewed the patient's chart and labs.  Questions were answered to the patient's satisfaction.    Vital Signs: LMP 05/26/2019 (Exact Date)  Constitutional: Well nourished, well developed female in no acute distress.  HEENT: normal Skin: Warm and dry.  Cardiovascular: Regular rate and rhythm.   Extremity: trace edema  Respiratory: Clear to auscultation bilateral. Normal respiratory effort Abdomen: FHT present Neuro: DTRs 2+, Cranial nerves grossly intact Psych: Alert and Oriented x3. No memory deficits. Normal mood and affect.  MS: normal gait, normal bilateral lower extremity ROM/strength/stability.  Pelvic exam: per RN No change from previous exam in office  First dose of cytotec placed: 25 mcg vaginal, 25 mcg buccal    Tresea Mall, CNM [facility] 02/11/2020  10:38 AM

## 2020-02-11 NOTE — Plan of Care (Signed)
Patient beginning induction as ordered. Patient educated on treatment plan and is in agreement

## 2020-02-11 NOTE — Progress Notes (Signed)
Patient asked again 3 more times for food. I relayed the request to Tresea Mall, CNM and midwife said the patient can have a sandwich tray now.

## 2020-02-11 NOTE — Progress Notes (Signed)
Patient just asked again for food. Her midwife allowed her to have a snack this morning before she started into labor, but the CNM ordered clear liquids for now. Since the morning snack, the patient has received 2 clear liquid trays and I have offered her everything we have available that is clear liquid. I explained the clear liquid diet and reasoning behind only allowing clear liquids in labor already 3 times, but patient is agitated that she is hungry and wants to receive regular tray because "this food is nasty" referring to the clear liquid trays. After this explanation she is compliant.

## 2020-02-11 NOTE — Progress Notes (Signed)
  Labor Progress Note   29 y.o. G2P1001 @ [redacted]w[redacted]d , admitted for  Pregnancy, Labor Management.   Subjective:  Patient feels strong contractions about every 15 minutes. She is coping well.   Objective:  BP 121/72   Pulse 81   Temp 98.2 F (36.8 C) (Oral)   Resp 17   Ht 5\' 4"  (1.626 m)   Wt 119.7 kg   LMP 05/26/2019 (Exact Date)   BMI 45.32 kg/m  Abd: gravid, ND, FHT present, mild tenderness on exam Extr: trace to 1+ bilateral pedal edema SVE: CERVIX: 2.5 cm dilated, 80 effaced, -2 station She did not tolerate exam well  EFM: FHR: 145 bpm, variability: moderate,  accelerations:  Present,  decelerations:  Present: 2 minute decel upon returning from the bathroom, variable, a few subtle lates with good return to baseline and variability Toco: Frequency: Every 1.5-5 minutes Labs: I have reviewed the patient's lab results.   Assessment & Plan:  G2P1001 @ [redacted]w[redacted]d, admitted for  Pregnancy and Labor/Delivery Management  1. Pain management: none. 2. FWB: FHT category II currently, has been category I all day.  3. ID: GBS negative 4. Labor management: start pitocin when contraction frequency spaces out and as fetus tolerates  All discussed with patient, see orders   [redacted]w[redacted]d, CNM [facility] 02/11/2020  6:54 PM

## 2020-02-11 NOTE — Progress Notes (Signed)
  Labor Progress Note   29 y.o. G2P1001 @ [redacted]w[redacted]d , admitted for  Pregnancy, Labor Management.   Subjective:  Not feeling contractions  Objective:  BP 117/71   Pulse 78   Temp 98.5 F (36.9 C) (Oral)   Resp 18   Ht 5\' 4"  (1.626 m)   Wt 119.7 kg   LMP 05/26/2019 (Exact Date)   BMI 45.32 kg/m  Abd: gravid, ND, FHT present, mild tenderness on exam Extr: trace to 1+ bilateral pedal edema SVE: CERVIX: 4 cm dilated, 90 effaced, -2 station  EFM: FHR: 140 bpm, variability: moderate,  accelerations:  Present,  decelerations:  Present a few late decelerations Toco: Frequency: Every 1-4 minutes Labs: I have reviewed the patient's lab results.   Assessment & Plan:  G2P1001 @ [redacted]w[redacted]d, admitted for  Pregnancy and Labor/Delivery Management  1. Pain management: none. 2. FWB: FHT category II and overall reassuring.  3. ID: GBS negative 4. Labor management: pitocin titration  All discussed with patient, see orders   [redacted]w[redacted]d, CNM [facility] 02/11/2020  10:20 PM

## 2020-02-12 ENCOUNTER — Encounter: Payer: Self-pay | Admitting: Obstetrics and Gynecology

## 2020-02-12 ENCOUNTER — Inpatient Hospital Stay: Payer: Medicaid Other | Admitting: Anesthesiology

## 2020-02-12 DIAGNOSIS — O403XX0 Polyhydramnios, third trimester, not applicable or unspecified: Principal | ICD-10-CM

## 2020-02-12 DIAGNOSIS — Z3A37 37 weeks gestation of pregnancy: Secondary | ICD-10-CM

## 2020-02-12 MED ORDER — COCONUT OIL OIL
1.0000 "application " | TOPICAL_OIL | Status: DC | PRN
  Administered 2020-02-13: 1 via TOPICAL
  Filled 2020-02-12: qty 120

## 2020-02-12 MED ORDER — SODIUM CHLORIDE 0.9 % IV SOLN: 250.0000 mL | INTRAVENOUS | Status: DC | PRN

## 2020-02-12 MED ORDER — LIDOCAINE-EPINEPHRINE (PF) 1.5 %-1:200000 IJ SOLN
INTRAMUSCULAR | Status: DC | PRN
  Administered 2020-02-12: 3 mL via PERINEURAL

## 2020-02-12 MED ORDER — DIPHENHYDRAMINE HCL 25 MG PO CAPS: 25.0000 mg | ORAL_CAPSULE | Freq: Four times a day (QID) | ORAL | Status: DC | PRN

## 2020-02-12 MED ORDER — LIDOCAINE HCL (PF) 1 % IJ SOLN
INTRAMUSCULAR | Status: DC | PRN
  Administered 2020-02-12: 1 mL
  Administered 2020-02-12: 3 mL

## 2020-02-12 MED ORDER — OXYCODONE-ACETAMINOPHEN 5-325 MG PO TABS: 2.0000 | ORAL_TABLET | ORAL | Status: DC | PRN

## 2020-02-12 MED ORDER — BUPIVACAINE HCL (PF) 0.25 % IJ SOLN
INTRAMUSCULAR | Status: DC | PRN
  Administered 2020-02-12: 4 mL via EPIDURAL

## 2020-02-12 MED ORDER — LACTATED RINGERS IV SOLN
500.0000 mL | Freq: Once | INTRAVENOUS | Status: AC
  Administered 2020-02-12: 500 mL via INTRAVENOUS

## 2020-02-12 MED ORDER — BUTORPHANOL TARTRATE 1 MG/ML IJ SOLN
INTRAMUSCULAR | Status: AC
  Filled 2020-02-12: qty 2

## 2020-02-12 MED ORDER — DIPHENHYDRAMINE HCL 50 MG/ML IJ SOLN: 12.5000 mg | INTRAMUSCULAR | Status: DC | PRN

## 2020-02-12 MED ORDER — BUTORPHANOL TARTRATE 1 MG/ML IJ SOLN
2.0000 mg | INTRAMUSCULAR | Status: DC | PRN
  Administered 2020-02-12: 2 mg via INTRAVENOUS

## 2020-02-12 MED ORDER — ONDANSETRON HCL 4 MG/2ML IJ SOLN: 4.0000 mg | INTRAMUSCULAR | Status: DC | PRN

## 2020-02-12 MED ORDER — MISOPROSTOL 200 MCG PO TABS
ORAL_TABLET | ORAL | Status: AC
  Filled 2020-02-12: qty 5

## 2020-02-12 MED ORDER — PHENYLEPHRINE 40 MCG/ML (10ML) SYRINGE FOR IV PUSH (FOR BLOOD PRESSURE SUPPORT): 80.0000 ug | PREFILLED_SYRINGE | INTRAVENOUS | Status: DC | PRN

## 2020-02-12 MED ORDER — SODIUM CHLORIDE 0.9% FLUSH: 3.0000 mL | INTRAVENOUS | Status: DC | PRN

## 2020-02-12 MED ORDER — EPHEDRINE 5 MG/ML INJ: 10.0000 mg | INTRAVENOUS | Status: DC | PRN

## 2020-02-12 MED ORDER — CARBOPROST TROMETHAMINE 250 MCG/ML IM SOLN
INTRAMUSCULAR | Status: AC
  Filled 2020-02-12: qty 1

## 2020-02-12 MED ORDER — IBUPROFEN 600 MG PO TABS
600.0000 mg | ORAL_TABLET | Freq: Four times a day (QID) | ORAL | Status: DC
  Administered 2020-02-12 – 2020-02-14 (×7): 600 mg via ORAL
  Filled 2020-02-12 (×7): qty 1

## 2020-02-12 MED ORDER — DIBUCAINE (PERIANAL) 1 % EX OINT: 1.0000 "application " | TOPICAL_OINTMENT | CUTANEOUS | Status: DC | PRN

## 2020-02-12 MED ORDER — ONDANSETRON HCL 4 MG PO TABS: 4.0000 mg | ORAL_TABLET | ORAL | Status: DC | PRN

## 2020-02-12 MED ORDER — OXYCODONE-ACETAMINOPHEN 5-325 MG PO TABS: 1.0000 | ORAL_TABLET | ORAL | Status: DC | PRN

## 2020-02-12 MED ORDER — BENZOCAINE-MENTHOL 20-0.5 % EX AERO
1.0000 "application " | INHALATION_SPRAY | CUTANEOUS | Status: DC | PRN
  Administered 2020-02-12: 1 via TOPICAL
  Filled 2020-02-12: qty 56

## 2020-02-12 MED ORDER — SENNOSIDES-DOCUSATE SODIUM 8.6-50 MG PO TABS
2.0000 | ORAL_TABLET | ORAL | Status: DC
  Administered 2020-02-13 – 2020-02-14 (×2): 2 via ORAL
  Filled 2020-02-12 (×2): qty 2

## 2020-02-12 MED ORDER — OXYTOCIN 40 UNITS IN NORMAL SALINE INFUSION - SIMPLE MED
INTRAVENOUS | Status: AC
  Administered 2020-02-12: 2.5 [IU] via INTRAVENOUS
  Filled 2020-02-12: qty 1000

## 2020-02-12 MED ORDER — SODIUM CHLORIDE 0.9% FLUSH: 3.0000 mL | Freq: Two times a day (BID) | INTRAVENOUS | Status: DC

## 2020-02-12 MED ORDER — FENTANYL 2.5 MCG/ML W/ROPIVACAINE 0.15% IN NS 100 ML EPIDURAL (ARMC)
EPIDURAL | Status: AC
  Filled 2020-02-12: qty 100

## 2020-02-12 MED ORDER — WITCH HAZEL-GLYCERIN EX PADS: 1.0000 "application " | MEDICATED_PAD | CUTANEOUS | Status: DC | PRN

## 2020-02-12 MED ORDER — SIMETHICONE 80 MG PO CHEW: 80.0000 mg | CHEWABLE_TABLET | ORAL | Status: DC | PRN

## 2020-02-12 MED ORDER — FENTANYL 2.5 MCG/ML W/ROPIVACAINE 0.15% IN NS 100 ML EPIDURAL (ARMC)
12.0000 mL/h | EPIDURAL | Status: DC
  Administered 2020-02-12: 10:00:00 12 mL/h via EPIDURAL
  Filled 2020-02-12: qty 100

## 2020-02-12 MED ORDER — ACETAMINOPHEN 325 MG PO TABS
650.0000 mg | ORAL_TABLET | ORAL | Status: DC | PRN
  Administered 2020-02-12 – 2020-02-14 (×5): 650 mg via ORAL
  Filled 2020-02-12 (×5): qty 2

## 2020-02-12 MED ORDER — ZOLPIDEM TARTRATE 5 MG PO TABS: 5.0000 mg | ORAL_TABLET | Freq: Every evening | ORAL | Status: DC | PRN

## 2020-02-12 MED ORDER — METHYLERGONOVINE MALEATE 0.2 MG/ML IJ SOLN
INTRAMUSCULAR | Status: AC
  Filled 2020-02-12: qty 1

## 2020-02-12 NOTE — Discharge Summary (Signed)
OB Discharge Summary     Patient Name: Janice Mccall DOB: 1990-10-26 MRN: [id number]  Date of admission: 02/11/2020 Delivering MD: [name], MD  Date of Delivery: 02/12/2020  Date of discharge: 02/14/2020  Admitting diagnosis: Encounter for induction of labor [Z34.90] Intrauterine pregnancy: [redacted]w[redacted]d     Secondary diagnosis: Polyhydramnios     Discharge diagnosis: Term Pregnancy Delivered, polyhydramnios                         Hospital course:  Induction of Labor With Vacuum Assisted Vaginal Delivery   29 y.o. yo G2P2002 at [redacted]w[redacted]d was admitted to the hospital 02/11/2020 for induction of labor.  Indication for induction: Polyhydramnios.  Patient had an uncomplicated labor course as follows: Membrane Rupture Time/Date: 9:55 AM ,02/12/2020   Intrapartum Procedures: Episiotomy: None [1]                                         Lacerations:  None [1]  Patient had delivery of a Viable infant.  Information for the patient's newborn:  Janice, Mccall [[id number]]  Delivery Method: Vag-Vacuum    02/12/2020  Details of delivery can be found in separate delivery note.  Patient had a routine postpartum course. Patient is discharged home 02/14/20.    Was started on Zoloft for history of postpartum depression and reporting some mild depression symptoms.  No SI/HI                                                             Post partum procedures:none  Complications: None  Physical exam on 02/14/2020: Vitals:   02/13/20 0800 02/13/20 1300 02/13/20 2332 02/14/20 0747  BP: 115/70 111/67 120/64 111/76  Pulse: 65 73 80 66  Resp: 18 19 18 16   Temp: 98.3 F (36.8 C) 98 F (36.7 C) 98.7 F (37.1 C) 97.6 F (36.4 C)  TempSrc: Oral Oral Oral Oral  SpO2: 100% 100% 97%   Weight:      Height:       General: alert, cooperative and no distress Lochia: appropriate Uterine Fundus: firm DVT Evaluation: No evidence of DVT seen on physical exam.  Labs: Lab Results  Component Value Date    WBC 7.9 02/13/2020   HGB 10.2 (L) 02/13/2020   HCT 30.6 (L) 02/13/2020   MCV 93.3 02/13/2020   PLT 147 (L) 02/13/2020   CMP Latest Ref Rng & Units 01/24/2020  Glucose 70 - 99 mg/dL 79  BUN 6 - 20 mg/dL 5(L)  Creatinine 03/25/2020 - 1.00 mg/dL 4.62  Sodium 7.03 - 500 mmol/L 135  Potassium 3.5 - 5.1 mmol/L 3.7  Chloride 98 - 111 mmol/L 106  CO2 22 - 32 mmol/L 20(L)  Calcium 8.9 - 10.3 mg/dL 9.1  Total Protein 6.5 - 8.1 g/dL 6.8  Total Bilirubin 0.3 - 1.2 mg/dL 0.9  Alkaline Phos 38 - 126 U/L 129(H)  AST 15 - 41 U/L 31  ALT 0 - 44 U/L 28    Discharge instruction: per After Visit Summary.  Medications:  Allergies as of 02/14/2020      Reactions   Prochlorperazine Swelling      Medication List  STOP taking these medications   clotrimazole-betamethasone cream Commonly known as: Lotrisone     TAKE these medications   acetaminophen 500 MG tablet Commonly known as: TYLENOL Take 500 mg by mouth every 6 (six) hours as needed.   multivitamin-prenatal 27-0.8 MG Tabs tablet Take 1 tablet by mouth daily at 12 noon.   sertraline 50 MG tablet Commonly known as: Zoloft Take 1 tablet (50 mg total) by mouth daily.       Diet: routine diet  Activity: Advance as tolerated. Pelvic rest for 6 weeks.   Outpatient follow up: Follow-up Information    Gae Dry, MD. Schedule an appointment as soon as possible for a visit in 1 week(s).   Specialty: Obstetrics and Gynecology Why: Postpartum depression check and BTL scheduling Contact information: [address] [place] [address] [phone]             Postpartum contraception: Tubal Ligation Rhogam Given postpartum: no Rubella vaccine given postpartum: no Varicella vaccine given postpartum: no TDaP given antepartum or postpartum: No  Newborn Data: Live born female  Birth Weight:   APGAR: 7, 8  Newborn Delivery   Birth date/time: 02/12/2020 17:11:00 Delivery type: Vaginal, Vacuum (Extractor)        Baby Feeding: Bottle  Disposition:home with mother  SIGNED:  Malachy Mood, MD 02/14/2020 9:37 AM

## 2020-02-12 NOTE — Anesthesia Preprocedure Evaluation (Signed)
Anesthesia Evaluation  Patient identified by MRN, date of birth, ID band Patient awake    Reviewed: Allergy & Precautions, H&P , NPO status , Patient's Chart, lab work & pertinent test results  History of Anesthesia Complications Negative for: history of anesthetic complications  Airway Mallampati: III   Neck ROM: limited  Mouth opening: Limited Mouth Opening  Dental  (+) Teeth Intact   Pulmonary Patient abstained from smoking.Not current smoker, former smoker,    Pulmonary exam normal        Cardiovascular hypertension, On Medications Normal cardiovascular exam     Neuro/Psych PSYCHIATRIC DISORDERS Depression negative neurological ROS     GI/Hepatic Neg liver ROS, GERD  Controlled,  Endo/Other  Hyperthyroidism   Renal/GU negative Renal ROS  negative genitourinary   Musculoskeletal   Abdominal   Peds  Hematology negative hematology ROS (+)   Anesthesia Other Findings   Reproductive/Obstetrics (+) Pregnancy                             Anesthesia Physical Anesthesia Plan  ASA: III  Anesthesia Plan: Epidural   Post-op Pain Management:    Induction:   PONV Risk Score and Plan:   Airway Management Planned:   Additional Equipment:   Intra-op Plan:   Post-operative Plan:   Informed Consent: I have reviewed the patients History and Physical, chart, labs and discussed the procedure including the risks, benefits and alternatives for the proposed anesthesia with the patient or authorized representative who has indicated his/her understanding and acceptance.     Dental Advisory Given  Plan Discussed with: Anesthesiologist  Anesthesia Plan Comments:         Anesthesia Quick Evaluation

## 2020-02-12 NOTE — Progress Notes (Signed)
  Labor Progress Note   29 y.o. G2P1001 @ [redacted]w[redacted]d , admitted for  Pregnancy, Labor Management.   Subjective:  Breathing through some contractions. Considering epidural. Discussed plan of care for Dr Tiburcio Pea to assess and break water after he is done in surgery. Recommend patient have epidural prior to rupture of membranes and to ease discomfort of exams.    Objective:  BP 121/61 (BP Location: Right Arm)   Pulse 66   Temp 98.2 F (36.8 C) (Oral)   Resp 16   Ht 5\' 4"  (1.626 m)   Wt 119.7 kg   LMP 05/26/2019 (Exact Date)   BMI 45.32 kg/m  Abd: gravid, ND, FHT present, mild tenderness on exam Extr: trace to 1+ bilateral pedal edema SVE: CERVIX: 4.5 cm dilated, 80-90 effaced, -2 station Exam difficult due to body habitus and patient intolerance  EFM: FHR: 125 bpm, variability: moderate,  accelerations:  Present,  decelerations:  Absent Toco: Frequency: Every 6-8 minutes Labs: I have reviewed the patient's lab results.   Assessment & Plan:  G2P1001 @ [redacted]w[redacted]d, admitted for  Pregnancy and Labor/Delivery Management  1. Pain management: none. 2. FWB: FHT category I currently with times of category II.  3. ID: GBS negative 4. Labor management: Pitocin was turned off early this morning due to deceleration. It is now back to 2 mu/min and titrating to establish adequate pattern  All discussed with patient, see orders   [redacted]w[redacted]d, CNM [facility] 02/12/2020  7:58 AM

## 2020-02-12 NOTE — Progress Notes (Signed)
Kiwi vacuum applied at 1709. 1 pull and 1 pop off at 1709. Delivery at 1711

## 2020-02-12 NOTE — Progress Notes (Signed)
  Labor Progress Note   29 y.o. G2P1001 @ [redacted]w[redacted]d , admitted for  Pregnancy, Labor Management.   Subjective:  No pain after epidural  Objective:  BP 139/71   Pulse 80   Temp 98.2 F (36.8 C) (Oral)   Resp 16   Ht 5\' 4"  (1.626 m)   Wt 119.7 kg   LMP 05/26/2019 (Exact Date)   SpO2 100%   BMI 45.32 kg/m  Abd: gravid, ND, FHT present, without guarding, without rebound tenderness on exam Extr: trace to 1+ bilateral pedal edema SVE: CERVIX: 4-5 cm dilated, 70 effaced, -3 station, presenting part VTX VAGINA: no abnormalities noted MEMBRANES: ruptured, clear fluid  EFM: FHR: 140 bpm, variability: moderate,  accelerations:  Present,  decelerations:  Absent Toco: Frequency: Every 5 minutes on Pitocin 5 mU/min  Assessment & Plan:  G2P1001 @ [redacted]w[redacted]d, admitted for  Pregnancy and Labor/Delivery Management  1. Pain management: epidural. 2. FWB: FHT category 1.  3. ID: GBS negative 4. Labor management: AROM clear and abundant now with careful monitoring that baby remains vertex Cont Pitocin IUPC and FSE applied  All discussed with patient, see orders  [redacted]w[redacted]d, MD, [facility], [facility] 02/12/2020  10:05 AM

## 2020-02-12 NOTE — Anesthesia Procedure Notes (Signed)
Epidural Patient location during procedure: OB Start time: 02/12/2020 8:59 AM End time: 02/12/2020 9:43 AM  Staffing Anesthesiologist: Alver Fisher, MD Resident/CRNA: Ginger Carne, CRNA Performed: anesthesiologist and resident/CRNA   Preanesthetic Checklist Completed: patient identified, IV checked, site marked, risks and benefits discussed, surgical consent, monitors and equipment checked, pre-op evaluation and timeout performed  Epidural Patient position: sitting Prep: ChloraPrep Patient monitoring: heart rate, continuous pulse ox and blood pressure Approach: midline Location: L3-L4 Injection technique: LOR saline  Needle:  Needle type: Tuohy  Needle gauge: 17 G Needle length: 9 cm and 9 Needle insertion depth: 9 cm Catheter type: closed end flexible Catheter size: 19 Gauge Catheter at skin depth: 14 cm Test dose: negative and 1.5% lidocaine with Epi 1:200 K  Assessment Sensory level: T10 Events: blood not aspirated, injection not painful, no injection resistance, no paresthesia and negative IV test  Additional Notes 3 attempt Pt. Evaluated and documentation done after procedure finished. Patient identified. Risks/Benefits/Options discussed with patient including but not limited to bleeding, infection, nerve damage, paralysis, failed block, incomplete pain control, headache, blood pressure changes, nausea, vomiting, reactions to medication both or allergic, itching and postpartum back pain. Confirmed with bedside nurse the patient's most recent platelet count. Confirmed with patient that they are not currently taking any anticoagulation, have any bleeding history or any family history of bleeding disorders. Patient expressed understanding and wished to proceed. All questions were answered. Sterile technique was used throughout the entire procedure. Please see nursing notes for vital signs. Test dose was given through epidural catheter and negative prior to continuing to  dose epidural or start infusion. Warning signs of high block given to the patient including shortness of breath, tingling/numbness in hands, complete motor block, or any concerning symptoms with instructions to call for help. Patient was given instructions on fall risk and not to get out of bed. All questions and concerns addressed with instructions to call with any issues or inadequate analgesia.   Patient tolerated the insertion well without immediate complications.Reason for block:procedure for pain

## 2020-02-12 NOTE — Progress Notes (Signed)
Admit Date: 02/11/2020 Today's Date: 02/12/2020  Post Partum Day 0  Subjective:  no complaints and tolerating PO  Objective: Temp:  [97.7 F (36.5 C)-98.7 F (37.1 C)] 98.3 F (36.8 C) (04/20 2115) Pulse Rate:  [66-107] 86 (04/20 2115) Resp:  [16-18] 18 (04/20 2115) BP: (93-139)/(40-94) 121/61 (04/20 2115) SpO2:  [98 %-100 %] 99 % (04/20 2115)  Physical Exam:  General: alert, cooperative and no distress Lochia: appropriate Uterine Fundus: firm Incision: none DVT Evaluation: No evidence of DVT seen on physical exam.  Recent Labs    02/11/20 0918  HGB 11.3*  HCT 35.1*    Assessment/Plan: Contraception Discussed, pt does indeed desire tubal.  Will discuss with team coming on as to timing, either post partum during this stay (pt prefers) vs interval 4-6 weeks from now.  (If done this stay, rec Thurs am; thus will allow diet tonight and in am tomorrow)  Infant doing well   LOS: 1 day   [name] [facility] 02/12/2020, 9:53 PM

## 2020-02-12 NOTE — Progress Notes (Signed)
  Labor Progress Note   29 y.o. G2P1001 @ [redacted]w[redacted]d , admitted for  Pregnancy, Labor Management.   Subjective:  Resting well  Objective:  BP 131/72   Pulse 87   Temp 97.7 F (36.5 C) (Oral)   Resp 16   Ht 5\' 4"  (1.626 m)   Wt 119.7 kg   LMP 05/26/2019 (Exact Date)   SpO2 100%   BMI 45.32 kg/m  Abd: gravid, ND, FHT present, mild tenderness on exam Extr: trace to 1+ bilateral pedal edema SVE: CERVIX: 9 cm dilated, 90 effaced, -2 station  EFM: FHR: 130 bpm, variability: moderate,  accelerations:  Present,  decelerations:  Absent Toco: Frequency: Every 3-5 minutes on Pitocin 12 mU/min Labs: I have reviewed the patient's lab results.   Assessment & Plan:  G2P1001 @ [redacted]w[redacted]d, admitted for  Pregnancy and Labor/Delivery Management  1. Pain management: epidural. 2. FWB: FHT category 1.  3. ID: GBS negative 4. Labor management: Cont to monitor progress for delivery Pitocin  All discussed with patient, see orders  [redacted]w[redacted]d, MD, Annamarie Major Ob/Gyn, [facility] 02/12/2020  2:12 PM

## 2020-02-12 NOTE — Anesthesia Procedure Notes (Signed)
Performed by: Rennie Hack, CRNA       

## 2020-02-13 ENCOUNTER — Encounter: Payer: Self-pay | Admitting: Obstetrics and Gynecology

## 2020-02-13 LAB — RPR: RPR Ser Ql: NONREACTIVE

## 2020-02-13 LAB — CBC
HCT: 30.6 % — ABNORMAL LOW (ref 36.0–46.0)
Hemoglobin: 10.2 g/dL — ABNORMAL LOW (ref 12.0–15.0)
MCH: 31.1 pg (ref 26.0–34.0)
MCHC: 33.3 g/dL (ref 30.0–36.0)
MCV: 93.3 fL (ref 80.0–100.0)
Platelets: 147 10*3/uL — ABNORMAL LOW (ref 150–400)
RBC: 3.28 MIL/uL — ABNORMAL LOW (ref 3.87–5.11)
RDW: 13.8 % (ref 11.5–15.5)
WBC: 7.9 10*3/uL (ref 4.0–10.5)
nRBC: 0 % (ref 0.0–0.2)

## 2020-02-13 MED ORDER — SALINE SPRAY 0.65 % NA SOLN
1.0000 | NASAL | Status: DC | PRN
  Filled 2020-02-13: qty 44

## 2020-02-13 NOTE — Anesthesia Postprocedure Evaluation (Signed)
Anesthesia Post Note  Patient: Janice Mccall  Procedure(s) Performed: AN AD HOC LABOR EPIDURAL  Patient location during evaluation: Mother Baby Anesthesia Type: Epidural Level of consciousness: awake and alert Pain management: pain level controlled Vital Signs Assessment: post-procedure vital signs reviewed and stable Respiratory status: spontaneous breathing, nonlabored ventilation and respiratory function stable Cardiovascular status: stable Postop Assessment: no headache, no backache and epidural receding Anesthetic complications: no     Last Vitals:  Vitals:   02/13/20 0317 02/13/20 0800  BP: 113/60 115/70  Pulse: 75 65  Resp: 18 18  Temp: 36.8 C 36.8 C  SpO2: 100% 100%    Last Pain:  Vitals:   02/13/20 0800  TempSrc: Oral  PainSc: 0-No pain                 Jimel Myler Lawerance Cruel

## 2020-02-13 NOTE — Progress Notes (Signed)
Post Partum Day 1 Subjective: voiding, tolerating PO and tired. Working with lactation on breast feeding. Baby may be tongue tied and was having some problems with hypoglycemia. Complaining of nosebleeds since having Covid test. Objective: Blood pressure 115/70, pulse 65, temperature 98.3 F (36.8 C), temperature source Oral, resp. rate 18, height 5\' 4"  (1.626 m), weight 119.7 kg, last menstrual period 05/26/2019, SpO2 100 %, unknown if currently breastfeeding.  Physical Exam:  General: alert, cooperative and no distress Lochia: appropriate Uterine Fundus: firm at U/ ML/ NT DVT Evaluation: No evidence of DVT seen on physical exam. +1 pitting edema.  Recent Labs    02/11/20 0918 02/13/20 0657  HGB 11.3* 10.2*  HCT 35.1* 30.6*  WBC 6.3 7.9  PLT 177 147*    Assessment/Plan:  Stable PPD #1-continue postpartum care Support lactation A POS/ RI/ VI Last 02/15/20 Breast Anticipate discharge tomorrow.   LOS: 2 days   [id number] 02/13/2020, 10:56 AM

## 2020-02-13 NOTE — Lactation Note (Signed)
This note was copied from a baby's chart. Lactation Consultation Note  Patient Name: Janice Mccall [id number] Date: 02/13/2020 Reason for consult: Follow-up assessment;1st time breastfeeding;Other (Comment)(BS concerns)  LC student assisted mom with DEBP usage after the first 3hrs while LC pace bottle fed baby 68mL of formula over 15 minutes. LC provided purple extra slow flow nipple to see if baby tolerated flow better. Baby did accept nipple easier, and had a strong suck pattern with less milk spillage throughout feed. Additional bottles and extra slow flow nipples provided. Mom encouraged to continue with frequent breast stimulation through pumping every 3 hours this evening and overnight. Encouraged to call out with any questions or for assistance.  Maternal Data    Feeding Feeding Type: Bottle Fed - Formula Nipple Type: Extra Slow Flow  LATCH Score                   Interventions Interventions: Breast feeding basics reviewed;DEBP  Lactation Tools Discussed/Used Tools: Pump   Consult Status Consult Status: Follow-up Date: 02/14/20 Follow-up type: In-patient    Danford Bad 02/13/2020, 3:33 PM

## 2020-02-13 NOTE — Lactation Note (Signed)
This note was copied from a baby's chart. Lactation Consultation Note  Patient Name: Janice Mccall [id number] Date: 02/13/2020 Reason for consult: Follow-up assessment;1st time breastfeeding  Upon entering baby was getting sugars checked. [name] was on bed with baby in a Boppy at her feet. FOB was asleep in chair. LC did an oral assessment and explained how the tongue supports feeding at breast since the Pediatrician mentioned a possible tongue tie and the resources we can provide for further consultation. [facility] student also felt inside the babies mouth to feel tongue restriction.   [facility] student attempted baby at breast for about 10 minutes with a 24NS. LC student taught [name] to hand express to entice baby as well as used some formula to get baby going at the breast. Baby was too lethargic even after [facility] student changed the babies diaper. Baby did have stool in the diaper but as soon as baby was back at breast baby went back to sleep. LC student then prepared a 24mL bottle of formula and set [name] up with a pump. [name] did bring her pump-in-style Medela pump from home as well. LC feed baby of bottle and overtime he got better with bottle feeding.  LC student set [name] up with the hospital pump and explained the cleaning and how the parts will work with her pump as well. [name] had to go up to a size 27 Flanges which fit more comfortably for her. After pumping session [name] asked when to expect milk while pumping and LC student explained that colostrum is thick and sticky so she may not see it in the first few pumping session and that is okay. Keep pumping to stimulate breasts and her body will work with her. [facility] student explained that baby does a better job of getting milk from the breast and overtime he will wake up and feed at the breasts. [facility] student told mom to pump every 2-3 hours and call out if she needs assistance with next lath attempt or pumping session. [name] seemed comfortable with the information given and  would call out if she needed to.  Maternal Data Has patient been taught Hand Expression?: Yes Does the patient have breastfeeding experience prior to this delivery?: No(Bottle fed 81 year old daughter)  Feeding Feeding Type: Bottle Fed - Formula Nipple Type: Slow - flow   Interventions Interventions: Breast feeding basics reviewed;Breast compression;Adjust position;Skin to skin;Support pillows;DEBP;Breast massage;Hand express  Lactation Tools Discussed/Used Tools: Bottle;Nipple Shields Nipple shield size: 24 Pump Review: Setup, frequency, and cleaning;Milk Storage Initiated by:: Rachelle Hora, LC student Date initiated:: 02/13/20   Consult Status Consult Status: Follow-up Date: 02/13/20 Follow-up type: In-patient    Thersa Salt [facility] 02/13/2020, 12:27 PM

## 2020-02-14 MED ORDER — SERTRALINE HCL 50 MG PO TABS: 50.0000 mg | ORAL_TABLET | Freq: Every day | ORAL | 2 refills | Status: DC

## 2020-02-14 NOTE — Progress Notes (Signed)
Discharge order received from doctor. Reviewed discharge instructions and prescriptions with patient and answered all questions. Follow up appointment instructions given. Patient verbalized understanding. ID bands checked. Patient discharged home with infant via wheelchair by nursing/auxillary.    Nakhia Levitan Garner, RN  

## 2020-02-14 NOTE — Lactation Note (Signed)
This note was copied from a baby's chart. Lactation Consultation Note  Patient Name: Janice Mccall [id number] Date: 02/14/2020 Reason for consult: Follow-up assessment   Maternal Data Formula Feeding for Exclusion: Yes Reason for exclusion: Mother's choice to formula and breast feed on admission Has patient been taught Hand Expression?: Yes Does the patient have breastfeeding experience prior to this delivery?: No  Feeding Feeding Type: Bottle Fed - Formula Nipple Type: Extra Slow Flow  LATCH Score                   Interventions    Lactation Tools Discussed/Used     Consult Status Consult Status: Complete Date: 02/14/20 Follow-up type: In-patient LC spoke with mother about her feeding decisions. Mother states that she has made a decision to bottle feed infant and does not wish to pump. LC educated mother on bottle feeding. Mother denies any concerns at this time.   Arlyss Gandy 02/14/2020, 11:50 AM

## 2020-02-14 NOTE — Discharge Instructions (Signed)

## 2020-02-17 ENCOUNTER — Observation Stay
Admission: EM | Admit: 2020-02-17 | Discharge: 2020-02-17 | Disposition: A | Discharge status: Home / Self Care | Payer: Medicaid Other | Attending: Emergency Medicine | Admitting: Emergency Medicine

## 2020-02-17 ENCOUNTER — Other Ambulatory Visit: Payer: Self-pay

## 2020-02-17 DIAGNOSIS — R197 Diarrhea, unspecified: Secondary | ICD-10-CM | POA: Diagnosis not present

## 2020-02-17 DIAGNOSIS — R102 Pelvic and perineal pain: Secondary | ICD-10-CM | POA: Diagnosis not present

## 2020-02-17 DIAGNOSIS — O1495 Unspecified pre-eclampsia, complicating the puerperium: Secondary | ICD-10-CM | POA: Diagnosis present

## 2020-02-17 DIAGNOSIS — R609 Edema, unspecified: Secondary | ICD-10-CM | POA: Insufficient documentation

## 2020-02-17 DIAGNOSIS — O9089 Other complications of the puerperium, not elsewhere classified: Principal | ICD-10-CM | POA: Insufficient documentation

## 2020-02-17 DIAGNOSIS — R519 Headache, unspecified: Secondary | ICD-10-CM | POA: Insufficient documentation

## 2020-02-17 LAB — COMPREHENSIVE METABOLIC PANEL
ALT: 40 U/L (ref 0–44)
AST: 29 U/L (ref 15–41)
Albumin: 2.9 g/dL — ABNORMAL LOW (ref 3.5–5.0)
Alkaline Phosphatase: 114 U/L (ref 38–126)
Anion gap: 7 (ref 5–15)
BUN: 12 mg/dL (ref 6–20)
CO2: 24 mmol/L (ref 22–32)
Calcium: 8.9 mg/dL (ref 8.9–10.3)
Chloride: 109 mmol/L (ref 98–111)
Creatinine, Ser: 0.66 mg/dL (ref 0.44–1.00)
GFR calc Af Amer: >60 mL/min (ref >60)
GFR calc non Af Amer: >60 mL/min (ref >60)
Glucose, Bld: 84 mg/dL (ref 70–99)
Potassium: 4.1 mmol/L (ref 3.5–5.1)
Sodium: 140 mmol/L (ref 135–145)
Total Bilirubin: 0.8 mg/dL (ref 0.3–1.2)
Total Protein: 6.4 g/dL — ABNORMAL LOW (ref 6.5–8.1)

## 2020-02-17 LAB — CBC WITH DIFFERENTIAL/PLATELET
Abs Immature Granulocytes: 0.02 10*3/uL (ref 0.00–0.07)
Basophils Absolute: 0 10*3/uL (ref 0.0–0.1)
Basophils Relative: 0 %
Eosinophils Absolute: 0.2 10*3/uL (ref 0.0–0.5)
Eosinophils Relative: 3 %
HCT: 31.5 % — ABNORMAL LOW (ref 36.0–46.0)
Hemoglobin: 10.3 g/dL — ABNORMAL LOW (ref 12.0–15.0)
Immature Granulocytes: 0 %
Lymphocytes Relative: 38 %
Lymphs Abs: 2.7 10*3/uL (ref 0.7–4.0)
MCH: 31.1 pg (ref 26.0–34.0)
MCHC: 32.7 g/dL (ref 30.0–36.0)
MCV: 95.2 fL (ref 80.0–100.0)
Monocytes Absolute: 0.5 10*3/uL (ref 0.1–1.0)
Monocytes Relative: 7 %
Neutro Abs: 3.7 10*3/uL (ref 1.7–7.7)
Neutrophils Relative %: 52 %
Platelets: 179 10*3/uL (ref 150–400)
RBC: 3.31 MIL/uL — ABNORMAL LOW (ref 3.87–5.11)
RDW: 13.5 % (ref 11.5–15.5)
WBC: 7.1 10*3/uL (ref 4.0–10.5)
nRBC: 0 % (ref 0.0–0.2)

## 2020-02-17 NOTE — Progress Notes (Signed)
Patient sent up from the ER for questionable post partum pre e for evaluation. Current BP WNL 2 plus edema in calf and ankles, headache 10 of 10 tylenol not helping. MD made aware of the above. Will order labs and cycle blood pressures.

## 2020-02-17 NOTE — ED Triage Notes (Addendum)
Pt reports she had a baby Tuesday and now is having headache, pelvic pain, back pain, upper abd pain, diarrhea (x4 last pm) that started last pm - pt reports swelling to bilat hands and feet since yesterday

## 2020-02-17 NOTE — Progress Notes (Signed)
Went to patients room to ask for a urine specimen. She asked how long this would take. I explaied to patient that it usually takes 30 mins to an hour for results to come back. Patient burst into tears and said she was going to leave that she couldn't wait because her husband just got a new job and he cant be late and he has to be there soon. She knows she is sick and doesn't feel well but she has to go. I asked to remove her IV and told her to please call [facility] in the morning if she is not feeling better or to come back to the hospital if her condition declines. I let her know about taking tylenol and ibuprofen. Patient declined to sign AMA papers or triage paperwork. Dr Tiburcio Pea informed of the above.

## 2020-02-17 NOTE — OB Triage Note (Signed)
Patient here for heache and swelling since last night. She rates the headache 10 of 10 and states that tylenol isn't helping. She is concerned bout the swelling that has started in the last few days after leaving the hospital.

## 2020-02-17 NOTE — ED Provider Notes (Signed)
[facility] Emergency Department Provider Note  ____________________________________________   First MD Initiated Contact with Patient 02/17/20 1234     (approximate)  I have reviewed the triage vital signs and the nursing notes.   HISTORY  Chief Complaint No chief complaint on file.    HPI Janice Mccall is a 29 y.o. female presents emergency department complaining of a headache, pelvic pain, back pain and upper abdominal pain.  She had some diarrhea yesterday.  Patient delivered a baby on Tuesday.  No history of hypertension throughout her pregnancy.  States she became concerned with swelling of the feet and ankles.  States it has gotten much worse since she got here to the emergency department.    Past Medical History:  Diagnosis Date  . Obesity   . Postpartum depression 2019  . Preeclampsia in postpartum period 2019   received magnesium sulfate  . Pregnancy induced hypertension   . Thyroid condition     Patient Active Problem List   Diagnosis Date Noted  . Encounter for induction of labor 02/11/2020  . Decreased consciousness 01/24/2020  . Decreased fetal movement 01/04/2020  . Polyhydramnios affecting pregnancy in third trimester 01/03/2020  . Abdominal pain in pregnancy, third trimester 12/05/2019  . Dizzy 10/23/2019  . Syncope 08/27/2019  . Supervision of high risk pregnancy, antepartum 07/25/2019  . Obesity affecting pregnancy, antepartum 07/25/2019  . BMI 40.0-44.9, adult ([place]) 07/25/2019  . Hyperprolactinemia ([place]) 12/02/2017  . Subclinical hyperthyroidism 09/07/2017    Past Surgical History:  Procedure Laterality Date  . UMBILICAL HERNIA REPAIR      Prior to Admission medications   Medication Sig Start Date End Date Taking? Authorizing Provider  acetaminophen (TYLENOL) 500 MG tablet Take 500 mg by mouth every 6 (six) hours as needed.    [provider]  Prenatal Vit-Fe Fumarate-FA (MULTIVITAMIN-PRENATAL) 27-0.8 MG  TABS tablet Take 1 tablet by mouth daily at 12 noon.    [provider]  sertraline (ZOLOFT) 50 MG tablet Take 1 tablet (50 mg total) by mouth daily. 02/14/20 02/13/21  Malachy Mood, MD  prochlorperazine (COMPAZINE) 10 MG tablet Take 1 tablet (10 mg total) by mouth every 6 (six) hours as needed for nausea or vomiting. 08/17/19 08/20/19  Rexene Agent, CNM    Allergies Prochlorperazine  Family History  Problem Relation Age of Onset  . Hypertension Maternal Grandmother   . Hypertension Paternal Grandmother     Social History Social History   Tobacco Use  . Smoking status: Former Smoker    Packs/day: 0.50    Types: Cigarettes  . Smokeless tobacco: Never Used  Substance Use Topics  . Alcohol use: No  . Drug use: Never    Review of Systems  Constitutional: No fever/chills Eyes: No visual changes. ENT: No sore throat. Respiratory: Denies cough Cardiovascular: Denies chest pain Gastrointestinal: Positive abdominal pain Genitourinary: Negative for dysuria.,  Positive pelvic pain Musculoskeletal: Negative for back pain. Skin: Negative for rash. Psychiatric: no mood changes,     ____________________________________________   PHYSICAL EXAM:  VITAL SIGNS: ED Triage Vitals  Enc Vitals Group     BP 02/17/20 1226 (!) 137/57     Pulse Rate 02/17/20 1226 73     Resp 02/17/20 1226 18     Temp 02/17/20 1226 98.8 F (37.1 C)     Temp Source 02/17/20 1226 Oral     SpO2 02/17/20 1226 97 %     Weight 02/17/20 1224 248 lb (112.5 kg)  Height 02/17/20 1224 5\' 3"  (1.6 m)     Head Circumference --      Peak Flow --      Pain Score 02/17/20 1223 10     Pain Loc --      Pain Edu? --      Excl. in GC? --     Constitutional: Alert and oriented. Well appearing and in no acute distress. Eyes: Conjunctivae are normal.  Head: Atraumatic. Nose: No congestion/rhinnorhea. Mouth/Throat: Mucous membranes are moist.   Neck:  supple no lymphadenopathy  noted Cardiovascular: Normal rate, regular rhythm. Heart sounds are normal Respiratory: Normal respiratory effort.  No retractions, lungs c t a  GU: deferred Musculoskeletal: FROM all extremities, warm and well perfused, positive for nonpitting edema noted in the lower extremities Neurologic:  Normal speech and language.  Skin:  Skin is warm, dry and intact. No rash noted. Psychiatric: Mood and affect are normal. Speech and behavior are normal.  ____________________________________________   LABS (all labs ordered are listed, but only abnormal results are displayed)  Labs Reviewed  COMPREHENSIVE METABOLIC PANEL  CBC WITH DIFFERENTIAL/PLATELET  URINALYSIS, COMPLETE (UACMP) WITH MICROSCOPIC   ____________________________________________   ____________________________________________  RADIOLOGY    ____________________________________________   PROCEDURES  Procedure(s) performed: No  Procedures    ____________________________________________   INITIAL IMPRESSION / ASSESSMENT AND PLAN / ED COURSE  Pertinent labs & imaging results that were available during my care of the patient were reviewed by me and considered in my medical decision making (see chart for details).   Patient is 29 year old female presents emergency department with concerns of headache, pelvic pain, back pain, upper abdominal pain, diarrhea x4 along with swelling to her hands and feet.  She delivered her child on Tuesday.  No hypertension during her pregnancy.  Physical exam shows patient to appear stable.  Blood pressure slightly elevated at 137/57.  Abdomen is slightly tender.  Swelling noted in the hands and the feet with nonpitting edema in the feet.  DDx: Dependent edema, preeclampsia postpartum, dehydration   Saline lock, CBC, metabolic panel, UA L&D was called.  Patient will be moved up to labor and delivery as a feel this could be preeclampsia after delivery.  Janice Mccall was evaluated  in Emergency Department on 02/17/2020 for the symptoms described in the history of present illness. She was evaluated in the context of the global COVID-19 pandemic, which necessitated consideration that the patient might be at risk for infection with the SARS-CoV-2 virus that causes COVID-19. Institutional protocols and algorithms that pertain to the evaluation of patients at risk for COVID-19 are in a state of rapid change based on information released by regulatory bodies including the CDC and federal and state organizations. These policies and algorithms were followed during the patient's care in the ED.   As part of my medical decision making, I reviewed the following data within the electronic MEDICAL RECORD NUMBER Nursing notes reviewed and incorporated, Labs reviewed , Old chart reviewed, Notes from prior ED visits and [place] Controlled Substance Database  ____________________________________________   FINAL CLINICAL IMPRESSION(S) / ED DIAGNOSES  Final diagnoses:  Preeclampsia in postpartum period      NEW MEDICATIONS STARTED DURING THIS VISIT:  New Prescriptions   No medications on file     Note:  This document was prepared using Dragon voice recognition software and may include unintentional dictation errors.    02/19/2020, PA-C 02/17/20 1251    02/19/20, MD 02/17/20 1345

## 2020-02-18 ENCOUNTER — Telehealth: Payer: Self-pay

## 2020-02-18 NOTE — Telephone Encounter (Signed)
Per SDJ she is okay to wait  And be seen for post partum visit, as these can be normal after delivering. Pt needs her post partum f/u scheduled with [facility] asap please. I believe they wanted her to be seen within 1-2 weeks. Please help pt get this scheduled. I have spoke with her and she is aware someone will call her. Thank you!

## 2020-02-18 NOTE — Telephone Encounter (Signed)
Scheduled

## 2020-02-18 NOTE — Telephone Encounter (Signed)
Pt called triage line stating she delivered on 4/20. She is experiencing feet swelling and pelvic cramping. Please advise if she needs to be seen. Last seen by SDJ on 4/19, delivered by [employer]

## 2020-02-28 ENCOUNTER — Ambulatory Visit: Payer: Medicaid Other | Admitting: Obstetrics & Gynecology

## 2020-02-29 ENCOUNTER — Ambulatory Visit (INDEPENDENT_AMBULATORY_CARE_PROVIDER_SITE_OTHER): Payer: Medicaid Other | Admitting: Obstetrics & Gynecology

## 2020-02-29 ENCOUNTER — Other Ambulatory Visit: Payer: Self-pay

## 2020-02-29 ENCOUNTER — Encounter: Payer: Self-pay | Admitting: Obstetrics & Gynecology

## 2020-02-29 VITALS — BP 120/80 | Ht 64.0 in | Wt 239.0 lb

## 2020-02-29 DIAGNOSIS — O99345 Other mental disorders complicating the puerperium: Secondary | ICD-10-CM | POA: Diagnosis not present

## 2020-02-29 DIAGNOSIS — Z3009 Encounter for other general counseling and advice on contraception: Secondary | ICD-10-CM

## 2020-02-29 DIAGNOSIS — F53 Postpartum depression: Secondary | ICD-10-CM | POA: Diagnosis not present

## 2020-02-29 MED ORDER — SERTRALINE HCL 100 MG PO TABS: 100.0000 mg | ORAL_TABLET | Freq: Every day | ORAL | 3 refills | Status: DC

## 2020-02-29 NOTE — Patient Instructions (Addendum)
PRE ADMISSION TESTING For Covid, prior to procedure Tuesday May 18 8:00-10:00 if surgery is May [facility] entrance (drive up)  Results in 48-72 hours You will not receive notification if test results are negative. If positive for Covid19, your provider will notify you by phone, with additional instructions.   Perinatal Depression  Increase ZOLOFT to 100 mg daily  When a woman feels excessive sadness, anger, or anxiety during pregnancy or during the first 12 months after she gives birth, she has a condition called perinatal depression. Depression can interfere with work, school, relationships, and other everyday activities. If it is not managed properly, it can also cause problems in the mother and her baby. Sometimes, perinatal depression is left untreated because symptoms are thought to be normal mood swings during and right after pregnancy. If you have symptoms of depression, it is important to talk with your health care provider. What are the causes? The exact cause of this condition is not known. Hormonal changes during and after pregnancy may play a role in causing perinatal depression. What increases the risk? You are more likely to develop this condition if:  You have a personal or family history of depression, anxiety, or mood disorders.  You experience a stressful life event during pregnancy, such as the death of a loved one.  You have a lot of regular life stress.  You do not have support from family members or loved ones, or you are in an abusive relationship. What are the signs or symptoms? Symptoms of this condition include:  Feeling sad or hopeless.  Feelings of guilt.  Feeling irritable or overwhelmed.  Changes in your appetite.  Lack of energy or motivation.  Sleep problems.  Difficulty concentrating or completing tasks.  Loss of interest in hobbies or relationships.  Headaches or stomach problems that do not go away. How is this  diagnosed? This condition is diagnosed based on a physical exam and mental evaluation. In some cases, your health care provider may use a depression screening tool. These tools include a list of questions that can help a health care provider diagnose depression. Your health care provider may refer you to a mental health expert who specializes in depression. How is this treated? This condition may be treated with:  Medicines. Your health care provider will only give you medicines that have been proven safe for pregnancy and breastfeeding.  Talk therapy with a mental health professional to help change your patterns of thinking (cognitive behavioral therapy).  Support groups.  Brain stimulation or light therapies.  Stress reduction therapies, such as mindfulness. Follow these instructions at home: Lifestyle  Do not use any products that contain nicotine or tobacco, such as cigarettes and e-cigarettes. If you need help quitting, ask your health care provider.  Do not use alcohol when you are pregnant. After your baby is born, limit alcohol intake to no more than 1 drink a day. One drink equals 12 oz of beer, 5 oz of wine, or 1 oz of hard liquor.  Consider joining a support group for new mothers. Ask your health care provider for recommendations.  Take good care of yourself. Make sure you: ? Get plenty of sleep. If you are having trouble sleeping, talk with your health care provider. ? Eat a healthy diet. This includes plenty of fruits and vegetables, whole grains, and lean proteins. ? Exercise regularly, as told by your health care provider. Ask your health care provider what exercises are safe for you. General instructions  Take over-the-counter and prescription medicines only as told by your health care provider.  Talk with your partner or family members about your feelings during pregnancy. Share any concerns or anxieties that you may have.  Ask for help with tasks or chores when you  need it. Ask friends and family members to provide meals, watch your children, or help with cleaning.  Keep all follow-up visits as told by your health care provider. This is important. Contact a health care provider if:  You (or people close to you) notice that you have any symptoms of depression.  You have depression and your symptoms get worse.  You experience side effects from medicines, such as nausea or sleep problems. Get help right away if:  You feel like hurting yourself, your baby, or someone else. If you ever feel like you may hurt yourself or others, or have thoughts about taking your own life, get help right away. You can go to your nearest emergency department or call:  Your local emergency services (911 in the [place]  A suicide crisis helpline, such as the National Suicide Prevention Lifeline at 985-830-8068. This is open 24 hours a day. Summary  Perinatal depression is when a woman feels excessive sadness, anger, or anxiety during pregnancy or during the first 12 months after she gives birth.  If perinatal depression is not treated, it can lead to health problems for the mother and her baby.  This condition is treated with medicines, talk therapy, stress reduction therapies, or a combination of two or more treatments.  Talk with your partner or family members about your feelings. Do not be afraid to ask for help. This information is not intended to replace advice given to you by your health care provider. Make sure you discuss any questions you have with your health care provider. Document Revised: 03/28/2019 Document Reviewed: 12/08/2016 Elsevier Patient Education  2020 Elsevier Inc.   Surgery to Prevent Pregnancy Female sterilization is surgery to prevent pregnancy. In this surgery, the fallopian tubes are either blocked or closed off. When the fallopian tubes are closed, the eggs that the ovaries release cannot enter the uterus, sperm cannot reach the eggs, and  you cannot get pregnant. Sterilization is permanent. It should only be done if you are sure that you do not want to be able to have children. What are the sterilization surgery options? There are several kinds of female sterilization surgeries. They include:  Laparoscopic tubal ligation. In this surgery, the fallopian tubes are tied off, sealed with heat, or blocked with a clip, ring, or clamp. A small portion of each fallopian tube may also be removed. This surgery is done through several small cuts (incisions) with special instruments that are inserted into your abdomen.  Postpartum tubal ligation. This is also called a mini-laparotomy. This surgery is done right after childbirth or 1 or 2 days after childbirth. In this surgery, the fallopian tubes are tied off, sealed with heat, or blocked with a clip, ring, or clamp. A small portion of each fallopian tube may also be removed. The surgery is done through a single incision in the abdomen.  Tubal ligation during a C-section. In this surgery, the fallopian tubes are tied off, sealed with heat, or blocked with a clip, ring, or clamp. A small portion of each fallopian tube may also be removed. The surgery is done at the same time as a C-section delivery. Is sterilization safe? Generally, sterilization is safe. Complications are rare. However, there are risks. They  include:  Bleeding.  Infection.  Reaction to medicine used during the procedure.  Injury to surrounding organs.  Failure of the procedure. How effective is sterilization? Sterilization is nearly 100% effective, but it can fail. In rare cases, the fallopian tubes can grow back together over time. If this happens, pregnancy may be possible and you will be able to get pregnant again. Women who have had this procedure have a higher chance of having an ectopic pregnancy. An ectopic pregnancy is a pregnancy that happens outside of the uterus. This kind of pregnancy can lead to serious  bleeding if it is not treated. What are the benefits?  It is usually effective for a lifetime.  It is usually safe.  It does not have the drawbacks of other types of birth control in that your hormones are not affected. Because of this, your menstrual periods, sexual desire, and sexual performance will not be affected. What are the drawbacks?  You will need to recover and may have complications after surgery.  If you change your mind and decide that you want to have children, you may not be able to. Sterilization may be reversed, but a reversal is not always successful.  It does not provide protection against STDs (sexually transmitted diseases).  It increases the chance of having an ectopic pregnancy. Follow these instructions at home:  Keep all follow-up visits as told by your health care provider. This is important. Summary  Female sterilization is surgery to prevent pregnancy.  There are different types of female sterilization surgeries.  Sterilization may be reversed, but a reversal is not always successful.  Sterilization does not protect against STDs. This information is not intended to replace advice given to you by your health care provider. Make sure you discuss any questions you have with your health care provider. Document Revised: 03/28/2019 Document Reviewed: 06/23/2018 Elsevier Patient Education  2020 [employer].

## 2020-02-29 NOTE — Progress Notes (Signed)
PRE-OPERATIVE HISTORY AND PHYSICAL EXAM  HPI:  Janice Mccall is a 29 y.o. [id number] No LMP recorded.; she is being admitted for surgery related to requested sterilization.  PMHx: Past Medical History:  Diagnosis Date  . Obesity   . Postpartum depression 2019  . Preeclampsia in postpartum period 2019   received magnesium sulfate  . Pregnancy induced hypertension   . Thyroid condition    Past Surgical History:  Procedure Laterality Date  . UMBILICAL HERNIA REPAIR     Family History  Problem Relation Age of Onset  . Hypertension Maternal Grandmother   . Hypertension Paternal Grandmother    Social History   Tobacco Use  . Smoking status: Former Smoker    Packs/day: 0.50    Types: Cigarettes  . Smokeless tobacco: Never Used  Substance Use Topics  . Alcohol use: No  . Drug use: Never    Current Outpatient Medications:  .  sertraline (ZOLOFT) 100 MG tablet, Take 1 tablet (100 mg total) by mouth daily., Disp: 90 tablet, Rfl: 3 .  acetaminophen (TYLENOL) 500 MG tablet, Take 500 mg by mouth every 6 (six) hours as needed., Disp: , Rfl:  .  Prenatal Vit-Fe Fumarate-FA (MULTIVITAMIN-PRENATAL) 27-0.8 MG TABS tablet, Take 1 tablet by mouth daily at 12 noon., Disp: , Rfl:  Allergies: Prochlorperazine  Review of Systems  Constitutional: Negative for chills, fever and malaise/fatigue.  HENT: Negative for congestion, sinus pain and sore throat.   Eyes: Negative for blurred vision and pain.  Respiratory: Negative for cough and wheezing.   Cardiovascular: Negative for chest pain and leg swelling.  Gastrointestinal: Negative for abdominal pain, constipation, diarrhea, heartburn, nausea and vomiting.  Genitourinary: Negative for dysuria, frequency, hematuria and urgency.  Musculoskeletal: Negative for back pain, joint pain, myalgias and neck pain.  Skin: Negative for itching and rash.  Neurological: Negative for dizziness, tremors and weakness.  Endo/Heme/Allergies: Does not  bruise/bleed easily.  Psychiatric/Behavioral: Positive for depression. The patient is nervous/anxious. The patient does not have insomnia.     Objective: BP 120/80   Ht 5\' 4"  (1.626 m)   Wt 239 lb (108.4 kg)   BMI 41.02 kg/m   Filed Weights   02/29/20 1447  Weight: 239 lb (108.4 kg)   Physical Exam Constitutional:      General: She is not in acute distress.    Appearance: She is well-developed.  HENT:     Head: Normocephalic and atraumatic. No laceration.     Right Ear: Hearing normal.     Left Ear: Hearing normal.     Mouth/Throat:     Pharynx: Uvula midline.  Eyes:     Pupils: Pupils are equal, round, and reactive to light.  Neck:     Thyroid: No thyromegaly.  Cardiovascular:     Rate and Rhythm: Normal rate and regular rhythm.     Heart sounds: No murmur. No friction rub. No gallop.   Pulmonary:     Effort: Pulmonary effort is normal. No respiratory distress.     Breath sounds: Normal breath sounds. No wheezing.  Chest:     Breasts:        Right: No mass, skin change or tenderness.        Left: No mass, skin change or tenderness.  Abdominal:     General: Bowel sounds are normal. There is no distension.     Palpations: Abdomen is soft.     Tenderness: There is no abdominal tenderness. There is no rebound.  Musculoskeletal:        General: Normal range of motion.     Cervical back: Normal range of motion and neck supple.  Neurological:     Mental Status: She is alert and oriented to person, place, and time.     Cranial Nerves: No cranial nerve deficit.  Skin:    General: Skin is warm and dry.  Psychiatric:        Judgment: Judgment normal.  Vitals reviewed.     Assessment: 1. Post partum depression   2. Sterilization consult   Plan Laparoscopic Sterilization  The patient has been fully informed about all methods of contraception, both temporary and permanent. She understands that tubal ligation is meant to be permanent, absolute and irreversible. She was  told that there is an approximately 1 in 400 chance of a pregnancy in the future after tubal ligation. She was told the short and long term complications of tubal ligation. She understands the risks from this surgery include, but are not limited to, the risks of anesthesia, hemorrhage, infection, perforation, and injury to adjacent structures, bowel, bladder and blood vessels.    Barnett Applebaum, MD, [facility], [facility] 02/29/2020  3:14 PM

## 2020-02-29 NOTE — Progress Notes (Signed)
  1. Post partum Depression Pt is a 29 yo G2P2 AA F s/p VAVD following IOL for polyhydramnios 2 weeks ago, discharged on Zoloft due to high risk factors for PPD; reports she still has sad and anxiety spells, and does not feel happy about her situation, also has stress from husband.  No SI, HI, or neglect.  Edinburgh 14 today    (Edinburgh 6 on 02/13/20 one day after delivery)  2. Contraception Counseling Patient presents for contraception counseling. The patient is not currently sexually active, but plans to be once heals from delivery. Pertinent past medical history: none.  Has signed tubal consent papers, but was unable to have PP BTL while in hospital after delivery.  Desires permanent sterility.  PMHx: She  has a past medical history of Obesity, Postpartum depression (2019), Preeclampsia in postpartum period (2019), Pregnancy induced hypertension, and Thyroid condition. Also,  has a past surgical history that includes Umbilical hernia repair., family history includes Hypertension in her maternal grandmother and paternal grandmother.,  reports that she has quit smoking. Her smoking use included cigarettes. She smoked 0.50 packs per day. She has never used smokeless tobacco. She reports that she does not drink alcohol or use drugs.  She has a current medication list which includes the following prescription(s): sertraline, acetaminophen, multivitamin-prenatal, and [DISCONTINUED] prochlorperazine. Also, is allergic to prochlorperazine.  Review of Systems  All other systems reviewed and are negative.   Objective: BP 120/80   Ht 5\' 4"  (1.626 m)   Wt 239 lb (108.4 kg)   BMI 41.02 kg/m  Physical Exam Constitutional:      General: She is not in acute distress.    Appearance: She is well-developed.  Musculoskeletal:        General: Normal range of motion.  Neurological:     Mental Status: She is alert and oriented to person, place, and time.  Skin:    General: Skin is warm and dry.    Vitals reviewed.     ASSESSMENT/PLAN:    Problem List Items Addressed This Visit    Post partum depression    -  Primary   Relevant Medications   sertraline (ZOLOFT) 100 MG tablet   Sterilization consult       All options discussed    The patient has been fully informed about all methods of contraception, both temporary and permanent. She understands that tubal ligation is meant to be permanent, absolute and irreversible. She was told that there is an approximately 1 in 400 chance of a pregnancy in the future after tubal ligation. She was told the short and long term complications of tubal ligation. She understands the risks from this surgery include, but are not limited to, the risks of anesthesia, hemorrhage, infection, perforation, and injury to adjacent structures, bowel, bladder and blood vessels.     To schedule soon     A total of 20 minutes were spent face-to-face with the patient as well as preparation, review, communication, and documentation during this encounter.    , MD, An[facility] Major [facility], [facility] 02/29/2020  3:10 PM

## 2020-02-29 NOTE — H&P (View-Only) (Signed)
PRE-OPERATIVE HISTORY AND PHYSICAL EXAM  HPI:  Janice Mccall is a 29 y.o. [id number] No LMP recorded.; she is being admitted for surgery related to requested sterilization.  PMHx: Past Medical History:  Diagnosis Date  . Obesity   . Postpartum depression 2019  . Preeclampsia in postpartum period 2019   received magnesium sulfate  . Pregnancy induced hypertension   . Thyroid condition    Past Surgical History:  Procedure Laterality Date  . UMBILICAL HERNIA REPAIR     Family History  Problem Relation Age of Onset  . Hypertension Maternal Grandmother   . Hypertension Paternal Grandmother    Social History   Tobacco Use  . Smoking status: Former Smoker    Packs/day: 0.50    Types: Cigarettes  . Smokeless tobacco: Never Used  Substance Use Topics  . Alcohol use: No  . Drug use: Never    Current Outpatient Medications:  .  sertraline (ZOLOFT) 100 MG tablet, Take 1 tablet (100 mg total) by mouth daily., Disp: 90 tablet, Rfl: 3 .  acetaminophen (TYLENOL) 500 MG tablet, Take 500 mg by mouth every 6 (six) hours as needed., Disp: , Rfl:  .  Prenatal Vit-Fe Fumarate-FA (MULTIVITAMIN-PRENATAL) 27-0.8 MG TABS tablet, Take 1 tablet by mouth daily at 12 noon., Disp: , Rfl:  Allergies: Prochlorperazine  Review of Systems  Constitutional: Negative for chills, fever and malaise/fatigue.  HENT: Negative for congestion, sinus pain and sore throat.   Eyes: Negative for blurred vision and pain.  Respiratory: Negative for cough and wheezing.   Cardiovascular: Negative for chest pain and leg swelling.  Gastrointestinal: Negative for abdominal pain, constipation, diarrhea, heartburn, nausea and vomiting.  Genitourinary: Negative for dysuria, frequency, hematuria and urgency.  Musculoskeletal: Negative for back pain, joint pain, myalgias and neck pain.  Skin: Negative for itching and rash.  Neurological: Negative for dizziness, tremors and weakness.  Endo/Heme/Allergies: Does not  bruise/bleed easily.  Psychiatric/Behavioral: Positive for depression. The patient is nervous/anxious. The patient does not have insomnia.     Objective: BP 120/80   Ht 5\' 4"  (1.626 m)   Wt 239 lb (108.4 kg)   BMI 41.02 kg/m   Filed Weights   02/29/20 1447  Weight: 239 lb (108.4 kg)   Physical Exam Constitutional:      General: She is not in acute distress.    Appearance: She is well-developed.  HENT:     Head: Normocephalic and atraumatic. No laceration.     Right Ear: Hearing normal.     Left Ear: Hearing normal.     Mouth/Throat:     Pharynx: Uvula midline.  Eyes:     Pupils: Pupils are equal, round, and reactive to light.  Neck:     Thyroid: No thyromegaly.  Cardiovascular:     Rate and Rhythm: Normal rate and regular rhythm.     Heart sounds: No murmur. No friction rub. No gallop.   Pulmonary:     Effort: Pulmonary effort is normal. No respiratory distress.     Breath sounds: Normal breath sounds. No wheezing.  Chest:     Breasts:        Right: No mass, skin change or tenderness.        Left: No mass, skin change or tenderness.  Abdominal:     General: Bowel sounds are normal. There is no distension.     Palpations: Abdomen is soft.     Tenderness: There is no abdominal tenderness. There is no rebound.  Musculoskeletal:        General: Normal range of motion.     Cervical back: Normal range of motion and neck supple.  Neurological:     Mental Status: She is alert and oriented to person, place, and time.     Cranial Nerves: No cranial nerve deficit.  Skin:    General: Skin is warm and dry.  Psychiatric:        Judgment: Judgment normal.  Vitals reviewed.     Assessment: 1. Post partum depression   2. Sterilization consult   Plan Laparoscopic Sterilization  The patient has been fully informed about all methods of contraception, both temporary and permanent. She understands that tubal ligation is meant to be permanent, absolute and irreversible. She was  told that there is an approximately 1 in 400 chance of a pregnancy in the future after tubal ligation. She was told the short and long term complications of tubal ligation. She understands the risks from this surgery include, but are not limited to, the risks of anesthesia, hemorrhage, infection, perforation, and injury to adjacent structures, bowel, bladder and blood vessels.    Paul Taelor Moncada, MD, FACOG [facility], [facility] 02/29/2020  3:14 PM  

## 2020-03-03 ENCOUNTER — Telehealth: Payer: Self-pay | Admitting: Obstetrics & Gynecology

## 2020-03-03 NOTE — Telephone Encounter (Signed)
-----   Message from Nadara Mustard, MD sent at 02/29/2020  3:02 PM EDT ----- Regarding: Surgery Surgery Booking Request Patient Full Name:  Janice Mccall  MRN: [id number]  DOB: 24-May-1991  Surgeon: [name], MD  Requested Surgery Date and Time: May 20 or 25 Primary Diagnosis AND Code: Z30.09, Sterilization Secondary Diagnosis and Code:  Surgical Procedure: Laparoscopy with Tubal Partial Salingectomy L&D Notification: No Admission Status: same day surgery Length of Surgery: 20 min Special Case Needs: No H&P: No Phone Interview???:  Yes Interpreter: No Language:  Medical Clearance:  No Special Scheduling Instructions: none Any known health/anesthesia issues, diabetes, sleep apnea, latex allergy, defibrillator/pacemaker?: No Acuity: P3   (P1 highest, P2 delay may cause harm, P3 low, elective gyn, P4 lowest)

## 2020-03-03 NOTE — Telephone Encounter (Signed)
Called pt to ask when she wanted to have her sterilization done - pt chose 5/20 with [facility]  H&P done at last visit  Covid test 5/18 @ 8-10:30, [facility] [employer], drive up and wear mask. Adv pt to quar until DOS.  Adv pt she may rec calls from pre-svc ctr and hosp pharmacy  Conf pt has Medicaid

## 2020-03-06 ENCOUNTER — Encounter
Admission: RE | Admit: 2020-03-06 | Discharge: 2020-03-06 | Disposition: A | Discharge status: Home / Self Care | Payer: Medicaid Other | Source: Ambulatory Visit | Attending: Obstetrics & Gynecology | Admitting: Obstetrics & Gynecology

## 2020-03-06 ENCOUNTER — Other Ambulatory Visit: Payer: Self-pay

## 2020-03-06 NOTE — Patient Instructions (Signed)
Your procedure is scheduled on: 03/13/20 Report to DAY SURGERY DEPARTMENT LOCATED ON 2ND FLOOR MEDICAL MALL ENTRANCE. To find out your arrival time please call [phone] between 1PM - 3PM on 03/12/20.  Remember: Instructions that are not followed completely may result in serious medical risk, up to and including death, or upon the discretion of your surgeon and anesthesiologist your surgery may need to be rescheduled.     _X__ 1. Do not eat food after midnight the night before your procedure.                 No gum chewing or hard candies. You may drink clear liquids up to 2 hours                 before you are scheduled to arrive for your surgery- DO not drink clear                 liquids within 2 hours of the start of your surgery.                 Clear Liquids include:  water, apple juice without pulp, clear carbohydrate                 drink such as Clearfast or Gatorade, Black Coffee or Tea (Do not add                 anything to coffee or tea). Diabetics water only  __X__2.  On the morning of surgery brush your teeth with toothpaste and water, you                 may rinse your mouth with mouthwash if you wish.  Do not swallow any              toothpaste of mouthwash.     _X__ 3.  No Alcohol for 24 hours before or after surgery.   _X__ 4.  Do Not Smoke or use e-cigarettes For 24 Hours Prior to Your Surgery.                 Do not use any chewable tobacco products for at least 6 hours prior to                 surgery.  ____  5.  Bring all medications with you on the day of surgery if instructed.   __X__  6.  Notify your doctor if there is any change in your medical condition      (cold, fever, infections).     Do not wear jewelry, make-up, hairpins, clips or nail polish. Do not wear lotions, powders, or perfumes.  Do not shave 48 hours prior to surgery. Men may shave face and neck. Do not bring valuables to the hospital.    [facility] is not responsible for any belongings or  valuables.  Contacts, dentures/partials or body piercings may not be worn into surgery. Bring a case for your contacts, glasses or hearing aids, a denture cup will be supplied. Leave your suitcase in the car. After surgery it may be brought to your room. For patients admitted to the hospital, discharge time is determined by your treatment team.   Patients discharged the day of surgery will not be allowed to drive home.   Please read over the following fact sheets that you were given:   MRSA Information  __X__ Take these medicines the morning of surgery with A SIP OF WATER:  1. sertraline (ZOLOFT) 50 MG tablet  2.   3.   4.  5.  6.  ____ Fleet Enema (as directed)   __X__ Use CHG Soap/SAGE wipes as directed  ____ Use inhalers on the day of surgery  ____ Stop metformin/Janumet/Farxiga 2 days prior to surgery    ____ Take 1/2 of usual insulin dose the night before surgery. No insulin the morning          of surgery.   ____ Stop Blood Thinners Coumadin/Plavix/Xarelto/Pleta/Pradaxa/Eliquis/Effient/Aspirin  on   Or contact your Surgeon, Cardiologist or Medical Doctor regarding  ability to stop your blood thinners  __X__ Stop Anti-inflammatories 7 days before surgery such as Advil, Ibuprofen, Motrin,  BC or Goodies Powder, Naprosyn, Naproxen, Aleve, Aspirin    __X__ Stop all herbal supplements, fish oil or vitamin E until after surgery.    ____ Bring C-Pap to the hospital.      How to Use an Incentive Spirometer An incentive spirometer is a tool that measures how well you are filling your lungs with each breath. Learning to take long, deep breaths using this tool can help you keep your lungs clear and active. This may help to reverse or lessen your chance of developing breathing (pulmonary) problems, especially infection. You may be asked to use a spirometer:  After a surgery.  If you have a lung problem or a history of smoking.  After a long period of time when you have  been unable to move or be active. If the spirometer includes an indicator to show the highest number that you have reached, your health care provider or respiratory therapist will help you set a goal. Keep a list (log) of your progress as told by your health care provider. What are the risks?  Breathing too quickly may cause dizziness or cause you to pass out. Take your time so you do not get dizzy or light-headed.  If you are in pain, you may need to take pain medicine before doing incentive spirometry. It is harder to take a deep breath if you are having pain. How to use your incentive spirometer  1. Sit up on the edge of your bed or on a chair. 2. Hold the incentive spirometer so that it is in an upright position. 3. Before you use the spirometer, breathe out normally. 4. Place the mouthpiece in your mouth. Make sure your lips are closed tightly around it. 5. Breathe in slowly and as deeply as you can through your mouth, causing the piston or the ball to rise toward the top of the chamber. 6. Hold your breath for 3-5 seconds, or for as long as possible. ? If the spirometer includes a coach indicator, use this to guide you in breathing. Slow down your breathing if the indicator goes above the marked areas. 7. Remove the mouthpiece from your mouth and breathe out normally. The piston or ball will return to the bottom of the chamber. 8. Rest for a few seconds, then repeat the steps 10 or more times. ? Take your time and take a few normal breaths between deep breaths so that you do not get dizzy or light-headed. ? Do this every 1-2 hours when you are awake. 9. If the spirometer includes a goal marker to show the highest number you have reached (best effort), use this as a goal to work toward during each repetition. 10. After each set of 10 deep breaths, cough a few times. This will help to make sure  that your lungs are clear. ? If you have an incision on your chest or abdomen from surgery, place  a pillow or a rolled-up towel firmly against the incision when you cough. This can help to reduce pain from coughing. General tips  When you become able to get out of bed, walk around often and continue to cough to help clear your lungs.  Keep using the incentive spirometer until your health care provider says it is okay to stop using it. If you have been in the hospital, you may be told to keep using the spirometer at home. Contact a health care provider if:  You are having difficulty using the spirometer.  You have trouble using the spirometer as often as instructed.  Your pain medicine is not giving enough relief for you to use the spirometer as told.  You have a fever.  You develop shortness of breath. Get help right away if:  You develop a cough with bloody mucus from the lungs (bloody sputum).  You have fluid or blood coming from an incision site after you cough. Summary  An incentive spirometer is a tool that can help you learn to take long, deep breaths to keep your lungs clear and active.  You may be asked to use a spirometer after a surgery, if you have a lung problem or a history of smoking, or if you have been inactive for a long period of time.  Use your incentive spirometer as instructed every 1-2 hours while you are awake.  If you have an incision on your chest or abdomen, place a pillow or a rolled-up towel firmly against your incision when you cough. This will help to reduce pain. This information is not intended to replace advice given to you by your health care provider. Make sure you discuss any questions you have with your health care provider. Document Revised: 05/11/2019 Document Reviewed: 08/24/2017 Elsevier Patient Education  2020 [employer].

## 2020-03-11 ENCOUNTER — Other Ambulatory Visit
Admission: RE | Admit: 2020-03-11 | Discharge: 2020-03-11 | Disposition: A | Discharge status: Home / Self Care | Payer: Medicaid Other | Source: Ambulatory Visit | Attending: Obstetrics & Gynecology | Admitting: Obstetrics & Gynecology

## 2020-03-11 DIAGNOSIS — Z01812 Encounter for preprocedural laboratory examination: Secondary | ICD-10-CM | POA: Diagnosis not present

## 2020-03-11 DIAGNOSIS — Z20822 Contact with and (suspected) exposure to covid-19: Secondary | ICD-10-CM | POA: Diagnosis not present

## 2020-03-11 LAB — SARS CORONAVIRUS 2 (TAT 6-24 HRS): SARS Coronavirus 2: NEGATIVE

## 2020-03-11 LAB — CBC
HCT: 37.9 % (ref 36.0–46.0)
Hemoglobin: 12.4 g/dL (ref 12.0–15.0)
MCH: 30 pg (ref 26.0–34.0)
MCHC: 32.7 g/dL (ref 30.0–36.0)
MCV: 91.5 fL (ref 80.0–100.0)
Platelets: 167 10*3/uL (ref 150–400)
RBC: 4.14 MIL/uL (ref 3.87–5.11)
RDW: 12.4 % (ref 11.5–15.5)
WBC: 7.4 10*3/uL (ref 4.0–10.5)
nRBC: 0 % (ref 0.0–0.2)

## 2020-03-11 LAB — BASIC METABOLIC PANEL
Anion gap: 8 (ref 5–15)
BUN: 13 mg/dL (ref 6–20)
CO2: 25 mmol/L (ref 22–32)
Calcium: 9.4 mg/dL (ref 8.9–10.3)
Chloride: 106 mmol/L (ref 98–111)
Creatinine, Ser: 0.7 mg/dL (ref 0.44–1.00)
GFR calc Af Amer: >60 mL/min (ref >60)
GFR calc non Af Amer: >60 mL/min (ref >60)
Glucose, Bld: 89 mg/dL (ref 70–99)
Potassium: 4.4 mmol/L (ref 3.5–5.1)
Sodium: 139 mmol/L (ref 135–145)

## 2020-03-11 LAB — TYPE AND SCREEN
ABO/RH(D): A POS
Antibody Screen: NEGATIVE
Extend sample reason: UNDETERMINED

## 2020-03-13 ENCOUNTER — Ambulatory Visit: Payer: Medicaid Other | Admitting: Certified Registered"

## 2020-03-13 ENCOUNTER — Encounter
Admission: RE | Disposition: A | Discharge status: Home / Self Care | Payer: Self-pay | Source: Home / Self Care | Attending: Obstetrics & Gynecology

## 2020-03-13 ENCOUNTER — Other Ambulatory Visit: Payer: Self-pay

## 2020-03-13 ENCOUNTER — Ambulatory Visit
Admission: RE | Admit: 2020-03-13 | Discharge: 2020-03-13 | Disposition: A | Discharge status: Home / Self Care | Payer: Medicaid Other | Attending: Obstetrics & Gynecology | Admitting: Obstetrics & Gynecology

## 2020-03-13 ENCOUNTER — Encounter: Payer: Self-pay | Admitting: Obstetrics & Gynecology

## 2020-03-13 DIAGNOSIS — Z87891 Personal history of nicotine dependence: Secondary | ICD-10-CM | POA: Insufficient documentation

## 2020-03-13 DIAGNOSIS — Z8759 Personal history of other complications of pregnancy, childbirth and the puerperium: Secondary | ICD-10-CM | POA: Diagnosis not present

## 2020-03-13 DIAGNOSIS — Z302 Encounter for sterilization: Secondary | ICD-10-CM | POA: Diagnosis present

## 2020-03-13 DIAGNOSIS — Z79899 Other long term (current) drug therapy: Secondary | ICD-10-CM | POA: Diagnosis not present

## 2020-03-13 DIAGNOSIS — F53 Postpartum depression: Secondary | ICD-10-CM | POA: Insufficient documentation

## 2020-03-13 DIAGNOSIS — Z6841 Body Mass Index (BMI) 40.0 and over, adult: Secondary | ICD-10-CM | POA: Diagnosis not present

## 2020-03-13 DIAGNOSIS — E669 Obesity, unspecified: Secondary | ICD-10-CM | POA: Diagnosis not present

## 2020-03-13 HISTORY — PX: LAPAROSCOPIC BILATERAL SALPINGECTOMY: SHX5889

## 2020-03-13 LAB — POCT PREGNANCY, URINE: Preg Test, Ur: NEGATIVE

## 2020-03-13 SURGERY — SALPINGECTOMY, BILATERAL, LAPAROSCOPIC
Anesthesia: Epidural | Laterality: Bilateral

## 2020-03-13 MED ORDER — BUPIVACAINE HCL (PF) 0.5 % IJ SOLN
INTRAMUSCULAR | Status: DC | PRN
  Administered 2020-03-13: 7 mL

## 2020-03-13 MED ORDER — LACTATED RINGERS IV SOLN: INTRAVENOUS | Status: DC

## 2020-03-13 MED ORDER — OXYCODONE-ACETAMINOPHEN 5-325 MG PO TABS: 1.0000 | ORAL_TABLET | ORAL | Status: DC | PRN

## 2020-03-13 MED ORDER — FAMOTIDINE 20 MG PO TABS
ORAL_TABLET | ORAL | Status: AC
  Administered 2020-03-13: 20 mg via ORAL
  Filled 2020-03-13: qty 1

## 2020-03-13 MED ORDER — ROCURONIUM BROMIDE 100 MG/10ML IV SOLN
INTRAVENOUS | Status: DC | PRN
  Administered 2020-03-13: 50 mg via INTRAVENOUS

## 2020-03-13 MED ORDER — DEXAMETHASONE SODIUM PHOSPHATE 10 MG/ML IJ SOLN
INTRAMUSCULAR | Status: AC
  Filled 2020-03-13: qty 1

## 2020-03-13 MED ORDER — FENTANYL CITRATE (PF) 100 MCG/2ML IJ SOLN
INTRAMUSCULAR | Status: AC
  Filled 2020-03-13: qty 2

## 2020-03-13 MED ORDER — OXYCODONE HCL 5 MG/5ML PO SOLN: 5.0000 mg | Freq: Once | ORAL | Status: DC | PRN

## 2020-03-13 MED ORDER — ACETAMINOPHEN 10 MG/ML IV SOLN: 1000.0000 mg | Freq: Once | INTRAVENOUS | Status: DC | PRN

## 2020-03-13 MED ORDER — ACETAMINOPHEN 325 MG PO TABS: 650.0000 mg | ORAL_TABLET | ORAL | Status: DC | PRN

## 2020-03-13 MED ORDER — SUGAMMADEX SODIUM 200 MG/2ML IV SOLN
INTRAVENOUS | Status: DC | PRN
  Administered 2020-03-13: 400 mg via INTRAVENOUS

## 2020-03-13 MED ORDER — BUPIVACAINE HCL (PF) 0.5 % IJ SOLN
INTRAMUSCULAR | Status: AC
  Filled 2020-03-13: qty 30

## 2020-03-13 MED ORDER — PROPOFOL 10 MG/ML IV BOLUS
INTRAVENOUS | Status: AC
  Filled 2020-03-13: qty 20

## 2020-03-13 MED ORDER — FENTANYL CITRATE (PF) 250 MCG/5ML IJ SOLN
INTRAMUSCULAR | Status: AC
  Filled 2020-03-13: qty 5

## 2020-03-13 MED ORDER — KETOROLAC TROMETHAMINE 30 MG/ML IJ SOLN
INTRAMUSCULAR | Status: AC
  Filled 2020-03-13: qty 1

## 2020-03-13 MED ORDER — DEXAMETHASONE SODIUM PHOSPHATE 10 MG/ML IJ SOLN
INTRAMUSCULAR | Status: DC | PRN
  Administered 2020-03-13: 10 mg via INTRAVENOUS

## 2020-03-13 MED ORDER — FENTANYL CITRATE (PF) 100 MCG/2ML IJ SOLN
25.0000 ug | INTRAMUSCULAR | Status: DC | PRN
  Administered 2020-03-13: 50 ug via INTRAVENOUS

## 2020-03-13 MED ORDER — MORPHINE SULFATE (PF) 4 MG/ML IV SOLN: 1.0000 mg | INTRAVENOUS | Status: DC | PRN

## 2020-03-13 MED ORDER — ONDANSETRON HCL 4 MG/2ML IJ SOLN: 4.0000 mg | Freq: Once | INTRAMUSCULAR | Status: DC | PRN

## 2020-03-13 MED ORDER — ONDANSETRON HCL 4 MG/2ML IJ SOLN
INTRAMUSCULAR | Status: AC
  Filled 2020-03-13: qty 2

## 2020-03-13 MED ORDER — FAMOTIDINE 20 MG PO TABS: 20.0000 mg | ORAL_TABLET | Freq: Once | ORAL | Status: AC

## 2020-03-13 MED ORDER — MIDAZOLAM HCL 2 MG/2ML IJ SOLN
INTRAMUSCULAR | Status: DC | PRN
  Administered 2020-03-13: 2 mg via INTRAVENOUS

## 2020-03-13 MED ORDER — KETOROLAC TROMETHAMINE 30 MG/ML IJ SOLN
INTRAMUSCULAR | Status: DC | PRN
  Administered 2020-03-13: 30 mg via INTRAVENOUS

## 2020-03-13 MED ORDER — LIDOCAINE HCL (CARDIAC) PF 100 MG/5ML IV SOSY
PREFILLED_SYRINGE | INTRAVENOUS | Status: DC | PRN
  Administered 2020-03-13: 100 mg via INTRAVENOUS

## 2020-03-13 MED ORDER — ONDANSETRON HCL 4 MG/2ML IJ SOLN
INTRAMUSCULAR | Status: DC | PRN
  Administered 2020-03-13: 4 mg via INTRAVENOUS

## 2020-03-13 MED ORDER — ACETAMINOPHEN 650 MG RE SUPP
650.0000 mg | RECTAL | Status: DC | PRN
  Filled 2020-03-13: qty 1

## 2020-03-13 MED ORDER — OXYCODONE HCL 5 MG PO TABS: 5.0000 mg | ORAL_TABLET | Freq: Once | ORAL | Status: DC | PRN

## 2020-03-13 MED ORDER — PROPOFOL 10 MG/ML IV BOLUS
INTRAVENOUS | Status: DC | PRN
  Administered 2020-03-13: 200 mg via INTRAVENOUS

## 2020-03-13 MED ORDER — OXYCODONE-ACETAMINOPHEN 5-325 MG PO TABS: 1.0000 | ORAL_TABLET | ORAL | 0 refills | Status: DC | PRN

## 2020-03-13 MED ORDER — FENTANYL CITRATE (PF) 100 MCG/2ML IJ SOLN
INTRAMUSCULAR | Status: DC | PRN
  Administered 2020-03-13: 100 ug via INTRAVENOUS
  Administered 2020-03-13: 50 ug via INTRAVENOUS

## 2020-03-13 MED ORDER — MIDAZOLAM HCL 2 MG/2ML IJ SOLN
INTRAMUSCULAR | Status: AC
  Filled 2020-03-13: qty 2

## 2020-03-13 SURGICAL SUPPLY — 40 items
BLADE SURG SZ11 CARB STEEL (BLADE) ×3 IMPLANT
CANISTER SUCT 1200ML W/VALVE (MISCELLANEOUS) ×3 IMPLANT
CATH ROBINSON RED A/P 16FR (CATHETERS) ×3 IMPLANT
CHLORAPREP W/TINT 26 (MISCELLANEOUS) ×3 IMPLANT
COVER WAND RF STERILE (DRAPES) IMPLANT
DERMABOND ADVANCED (GAUZE/BANDAGES/DRESSINGS) ×2
DERMABOND ADVANCED .7 DNX12 (GAUZE/BANDAGES/DRESSINGS) ×1 IMPLANT
DRSG TELFA 4X3 1S NADH ST (GAUZE/BANDAGES/DRESSINGS) IMPLANT
GLOVE BIO SURGEON STRL SZ8 (GLOVE) ×6 IMPLANT
GLOVE INDICATOR 8.0 STRL GRN (GLOVE) ×3 IMPLANT
GOWN STRL REUS W/ TWL LRG LVL3 (GOWN DISPOSABLE) ×1 IMPLANT
GOWN STRL REUS W/ TWL XL LVL3 (GOWN DISPOSABLE) ×1 IMPLANT
GOWN STRL REUS W/TWL LRG LVL3 (GOWN DISPOSABLE) ×2
GOWN STRL REUS W/TWL XL LVL3 (GOWN DISPOSABLE) ×4
GRASPER SUT TROCAR 14GX15 (MISCELLANEOUS) ×3 IMPLANT
IRRIGATION STRYKERFLOW (MISCELLANEOUS) IMPLANT
IRRIGATOR STRYKERFLOW (MISCELLANEOUS)
IV LACTATED RINGERS 1000ML (IV SOLUTION) IMPLANT
KIT PINK PAD W/HEAD ARE REST (MISCELLANEOUS) ×3
KIT PINK PAD W/HEAD ARM REST (MISCELLANEOUS) ×1 IMPLANT
LABEL OR SOLS (LABEL) ×3 IMPLANT
NEEDLE VERESS 14GA 120MM (NEEDLE) ×3 IMPLANT
NS IRRIG 500ML POUR BTL (IV SOLUTION) ×3 IMPLANT
PACK GYN LAPAROSCOPIC (MISCELLANEOUS) ×3 IMPLANT
PAD PREP 24X41 OB/GYN DISP (PERSONAL CARE ITEMS) ×3 IMPLANT
POUCH SPECIMEN RETRIEVAL 10MM (ENDOMECHANICALS) IMPLANT
SCISSORS METZENBAUM CVD 33 (INSTRUMENTS) ×3 IMPLANT
SET TUBE SMOKE EVAC HIGH FLOW (TUBING) ×3 IMPLANT
SHEARS HARMONIC ACE PLUS 36CM (ENDOMECHANICALS) ×2 IMPLANT
SLEEVE ENDOPATH XCEL 5M (ENDOMECHANICALS) ×2 IMPLANT
SPONGE GAUZE 2X2 8PLY STER LF (GAUZE/BANDAGES/DRESSINGS)
SPONGE GAUZE 2X2 8PLY STRL LF (GAUZE/BANDAGES/DRESSINGS) IMPLANT
STRAP SAFETY 5IN WIDE (MISCELLANEOUS) ×3 IMPLANT
SUT VIC AB 0 CT1 36 (SUTURE) ×3 IMPLANT
SUT VIC AB 2-0 UR6 27 (SUTURE) IMPLANT
SUT VIC AB 4-0 PS2 18 (SUTURE) IMPLANT
SYR 10ML LL (SYRINGE) ×3 IMPLANT
SYSTEM WECK SHIELD CLOSURE (TROCAR) IMPLANT
TROCAR ENDO BLADELESS 11MM (ENDOMECHANICALS) IMPLANT
TROCAR XCEL NON-BLD 5MMX100MML (ENDOMECHANICALS) ×3 IMPLANT

## 2020-03-13 NOTE — Interval H&P Note (Signed)
History and Physical Interval Note:  03/13/2020 1:05 PM  Janice Mccall  has presented today for surgery, with the diagnosis of Sterilization.  The various methods of treatment have been discussed with the patient and family. After consideration of risks, benefits and other options for treatment, the patient has consented to  Procedure(s): LAPAROSCOPIC BILATERAL SALPINGECTOMY (Bilateral) as a surgical intervention.  The patient's history has been reviewed, patient examined, no change in status, stable for surgery.  I have reviewed the patient's chart and labs.  Questions were answered to the patient's satisfaction.     [name]

## 2020-03-13 NOTE — Transfer of Care (Signed)
Immediate Anesthesia Transfer of Care Note  Patient: Janice Mccall  Procedure(s) Performed: LAPAROSCOPIC BILATERAL SALPINGECTOMY (Bilateral )  Patient Location: PACU  Anesthesia Type:General  Level of Consciousness: awake and drowsy  Airway & Oxygen Therapy: Patient Spontanous Breathing  Post-op Assessment: Report given to RN  Post vital signs: stable  Last Vitals:  Vitals Value Taken Time  BP 128/79 03/13/20 1440  Temp    Pulse 66 03/13/20 1442  Resp 20 03/13/20 1442  SpO2 100 % 03/13/20 1442  Vitals shown include unvalidated device data.  Last Pain:  Vitals:   03/13/20 1211  TempSrc: Temporal  PainSc: 0-No pain         Complications: No apparent anesthesia complications

## 2020-03-13 NOTE — Discharge Instructions (Addendum)
Laparoscopic Tubal Ligation, Care After This sheet gives you information about how to care for yourself after your procedure. Your health care provider may also give you more specific instructions. If you have problems or questions, contact your health care provider. What can I expect after the procedure? After the procedure, it is common to have:  A sore throat.  Discomfort in your shoulder.  Mild discomfort or cramping in your abdomen.  Gas pains.  Pain or soreness in the area where the surgical incision was made.  A bloated feeling.  Tiredness.  Nausea.  Vomiting. Follow these instructions at home: Medicines  Take over-the-counter and prescription medicines only as told by your health care provider.  Do not take aspirin because it can cause bleeding.  Ask your health care provider if the medicine prescribed to you: ? Requires you to avoid driving or using heavy machinery. ? Can cause constipation. You may need to take actions to prevent or treat constipation, such as:  Drink enough fluid to keep your urine pale yellow.  Take over-the-counter or prescription medicines.  Eat foods that are high in fiber, such as beans, whole grains, and fresh fruits and vegetables.  Limit foods that are high in fat and processed sugars, such as fried or sweet foods. Incision care      Follow instructions from your health care provider about how to take care of your incision. Make sure you: ? Wash your hands with soap and water before and after you change your bandage (dressing). If soap and water are not available, use hand sanitizer. ? Change your dressing as told by your health care provider. ? Leave stitches (sutures), skin glue, or adhesive strips in place. These skin closures may need to stay in place for 2 weeks or longer. If adhesive strip edges start to loosen and curl up, you may trim the loose edges. Do not remove adhesive strips completely unless your health care provider  tells you to do that.  Check your incision area every day for signs of infection. Check for: ? Redness, swelling, or pain. ? Fluid or blood. ? Warmth. ? Pus or a bad smell. Activity  Rest as told by your health care provider.  Avoid sitting for a long time without moving. Get up to take short walks every 1-2 hours. This is important to improve blood flow and breathing. Ask for help if you feel weak or unsteady.  Return to your normal activities as told by your health care provider. Ask your health care provider what activities are safe for you. General instructions  Do not take baths, swim, or use a hot tub until your health care provider approves. Ask your health care provider if you may take showers. You may only be allowed to take sponge baths.  Have someone help you with your daily household tasks for the first few days.  Keep all follow-up visits as told by your health care provider. This is important. Contact a health care provider if:  You have redness, swelling, or pain around your incision.  Your incision feels warm to the touch.  You have pus or a bad smell coming from your incision.  The edges of your incision break open after the sutures have been removed.  Your pain does not improve after 2-3 days.  You have a rash.  You repeatedly become dizzy or light-headed.  Your pain medicine is not helping. Get help right away if you:  Have a fever.  Faint.  Have increasing   pain in your abdomen.  Have severe pain in one or both of your shoulders.  Have fluid or blood coming from your sutures or from your vagina.  Have shortness of breath or difficulty breathing.  Have chest pain or leg pain.  Have ongoing nausea, vomiting, or diarrhea. Summary  After the procedure, it is common to have mild discomfort or cramping in your abdomen.  Take over-the-counter and prescription medicines only as told by your health care provider.  Watch for symptoms that should  prompt you to call your health care provider.  Keep all follow-up visits as told by your health care provider. This is important. This information is not intended to replace advice given to you by your health care provider. Make sure you discuss any questions you have with your health care provider. Document Revised: 03/20/2019 Document Reviewed: 09/05/2018 Elsevier Patient Education  2020 [employer]   AMBULATORY SURGERY  DISCHARGE INSTRUCTIONS   1) The drugs that you were given will stay in your system until tomorrow so for the next 24 hours you should not:  A) Drive an automobile B) Make any legal decisions C) Drink any alcoholic beverage   2) You may resume regular meals tomorrow.  Today it is better to start with liquids and gradually work up to solid foods.  You may eat anything you prefer, but it is better to start with liquids, then soup and crackers, and gradually work up to solid foods.   3) Please notify your doctor immediately if you have any unusual bleeding, trouble breathing, redness and pain at the surgery site, drainage, fever, or pain not relieved by medication.    4) Additional Instructions:        Please contact your physician with any problems or Same Day Surgery at [phone], Monday through Friday 6 am to 4 pm, or [place] [facility] [place] Main number at [phone]. 

## 2020-03-13 NOTE — Anesthesia Preprocedure Evaluation (Signed)
Anesthesia Evaluation  Patient identified by MRN, date of birth, ID band Patient awake    Reviewed: Allergy & Precautions, H&P , NPO status , Patient's Chart, lab work & pertinent test results  History of Anesthesia Complications Negative for: history of anesthetic complications  Airway Mallampati: II  TM Distance: >3 FB Neck ROM: limited  Mouth opening: Limited Mouth Opening  Dental no notable dental hx. (+) Teeth Intact, Dental Advisory Given   Pulmonary Patient abstained from smoking.Not current smoker, former smoker,    Pulmonary exam normal breath sounds clear to auscultation       Cardiovascular (-) hypertensionnegative cardio ROS Normal cardiovascular exam Rhythm:Regular Rate:Normal - Systolic murmurs    Neuro/Psych PSYCHIATRIC DISORDERS Depression negative neurological ROS     GI/Hepatic Neg liver ROS, GERD  Controlled,  Endo/Other  Hyperthyroidism   Renal/GU negative Renal ROS  negative genitourinary   Musculoskeletal   Abdominal   Peds  Hematology negative hematology ROS (+)   Anesthesia Other Findings   Reproductive/Obstetrics                            Anesthesia Physical  Anesthesia Plan  ASA: II  Anesthesia Plan: Epidural   Post-op Pain Management:    Induction: Intravenous  PONV Risk Score and Plan: 4 or greater and Ondansetron, Dexamethasone and Midazolam  Airway Management Planned: Oral ETT  Additional Equipment: None  Intra-op Plan:   Post-operative Plan: Extubation in OR  Informed Consent: I have reviewed the patients History and Physical, chart, labs and discussed the procedure including the risks, benefits and alternatives for the proposed anesthesia with the patient or authorized representative who has indicated his/her understanding and acceptance.     Dental Advisory Given  Plan Discussed with: CRNA and Surgeon  Anesthesia Plan Comments:  (Discussed risks of anesthesia with patient, including PONV, sore throat, lip/dental damage. Rare risks discussed as well, such as cardiorespiratory and neurological sequelae. Patient understands. Patient is not breastfeeding.)        Anesthesia Quick Evaluation

## 2020-03-13 NOTE — Anesthesia Procedure Notes (Signed)
Procedure Name: Intubation Date/Time: 03/13/2020 1:49 PM Performed by: Rodney Booze, CRNA Pre-anesthesia Checklist: Patient identified, Emergency Drugs available, Suction available, Patient being monitored and Timeout performed Patient Re-evaluated:Patient Re-evaluated prior to induction Oxygen Delivery Method: Circle system utilized Preoxygenation: Pre-oxygenation with 100% oxygen Induction Type: IV induction Ventilation: Mask ventilation without difficulty Laryngoscope Size: Miller and 1 Grade View: Grade I Tube type: Oral Tube size: 7.0 mm Number of attempts: 1 Airway Equipment and Method: Stylet (sniffing position with blankets) Placement Confirmation: ETT inserted through vocal cords under direct vision,  positive ETCO2 and breath sounds checked- equal and bilateral Tube secured with: Tape Dental Injury: Teeth and Oropharynx as per pre-operative assessment

## 2020-03-13 NOTE — Op Note (Signed)
  Operative Note   03/13/2020  PRE-OP DIAGNOSIS: Desire for permanent sterilization  POST-OP DIAGNOSIS: same   PROCEDURE: Procedure(s): LAPAROSCOPIC BILATERAL SALPINGECTOMY   SURGEON: Annamarie Major, MD, FACOG  ANESTHESIA: Choice   ESTIMATED BLOOD LOSS: Min  COMPLICATIONS: none  DISPOSITION: PACU - hemodynamically stable.  CONDITION: stable  FINDINGS: Laparoscopic survey of the abdomen revealed a grossly normal uterus, tubes, ovaries, liver edge, gallbladder edge and appendix, No intra-abdominal adhesions were noted.  PROCEDURE IN DETAIL: The patient was taken to the OR where anesthesia was administed. The patient was positioned in dorsal lithotomy in the [place] stirrups. The patient was then examined under anesthesia with the above noted findings. The patient was prepped and draped in the normal sterile fashion and bladder was drained using a red rubber cathater. Speculum exam normal, and a sponge stick was placed for manipulation purposes.  Attention was turned to the patient's abdomen where a 5 mm skin incision was made in the umbilical fold, after injection of local anesthesia. The Veress step needle was carefully introduced into the peritoneal cavity with placement confirmed using the hanging drop technique.  Pneumoperitoneum was obtained. The 5 mm port was then placed under direct visualization with the operative laparoscope  The above noted findings.  Trendelenburg.  A 5 mm trocar was then placed in the right lower quadrant under direct visualization with the laparoscope.  Right and left fallopian tubes are identified and followed out to their fimbria.  Each tube is excised utilizing the Harmonic scapel to include the fibria.  No injuries or bleeding was noted.  All instruments and ports were then removed from the abdomen after gas was expelled and patient was leveled.   The skin was closed with skin adhesive. The patient tolerated the procedure well. All counts were correct x 2. The  patient was transferred to the recovery room awake, alert and breathing independently.  Annamarie Major, MD, [facility], [facility] 03/13/2020  2:34 PM

## 2020-03-14 NOTE — Anesthesia Postprocedure Evaluation (Signed)
Anesthesia Post Note  Patient: Janice Mccall  Procedure(s) Performed: LAPAROSCOPIC BILATERAL SALPINGECTOMY (Bilateral )  Patient location during evaluation: PACU Anesthesia Type: Epidural Level of consciousness: awake and alert Pain management: pain level controlled Vital Signs Assessment: post-procedure vital signs reviewed and stable Respiratory status: spontaneous breathing, nonlabored ventilation, respiratory function stable and patient connected to nasal cannula oxygen Cardiovascular status: blood pressure returned to baseline and stable Postop Assessment: no apparent nausea or vomiting Anesthetic complications: no     Last Vitals:  Vitals:   03/13/20 1555 03/13/20 1615  BP: 111/77 118/77  Pulse: 79 71  Resp: 16 16  Temp: (!) 36.3 C   SpO2: 99% 100%    Last Pain:  Vitals:   03/13/20 1615  TempSrc:   PainSc: 0-No pain                 Cleda Mccreedy Trace Cederberg

## 2020-03-17 LAB — SURGICAL PATHOLOGY

## 2020-03-18 ENCOUNTER — Telehealth: Payer: Self-pay

## 2020-03-18 ENCOUNTER — Other Ambulatory Visit: Payer: Self-pay | Admitting: Obstetrics & Gynecology

## 2020-03-18 MED ORDER — OXYCODONE-ACETAMINOPHEN 5-325 MG PO TABS: 1.0000 | ORAL_TABLET | ORAL | 0 refills | Status: DC | PRN

## 2020-03-18 NOTE — Telephone Encounter (Signed)
ERx done (#15), if continues w severe pain then needs to be seen

## 2020-03-18 NOTE — Telephone Encounter (Signed)
Pt states she is out of pain meds and would like a refill, she is still in pain. Pt aware RPH is not in the office and I would send him a message . I advised her to try ibuprofen and tylenol to see if that helps.

## 2020-03-18 NOTE — Telephone Encounter (Signed)
Pt aware.

## 2020-03-27 ENCOUNTER — Ambulatory Visit (INDEPENDENT_AMBULATORY_CARE_PROVIDER_SITE_OTHER): Payer: Medicaid Other | Admitting: Obstetrics & Gynecology

## 2020-03-27 ENCOUNTER — Other Ambulatory Visit: Payer: Self-pay

## 2020-03-27 ENCOUNTER — Other Ambulatory Visit (HOSPITAL_COMMUNITY)
Admission: RE | Admit: 2020-03-27 | Discharge: 2020-03-27 | Disposition: A | Discharge status: Home / Self Care | Payer: Medicaid Other | Source: Ambulatory Visit | Attending: Obstetrics & Gynecology | Admitting: Obstetrics & Gynecology

## 2020-03-27 ENCOUNTER — Encounter: Payer: Self-pay | Admitting: Obstetrics & Gynecology

## 2020-03-27 DIAGNOSIS — O99345 Other mental disorders complicating the puerperium: Secondary | ICD-10-CM

## 2020-03-27 DIAGNOSIS — Z124 Encounter for screening for malignant neoplasm of cervix: Secondary | ICD-10-CM

## 2020-03-27 DIAGNOSIS — F53 Postpartum depression: Secondary | ICD-10-CM

## 2020-03-27 MED ORDER — SERTRALINE HCL 100 MG PO TABS
100.0000 mg | ORAL_TABLET | Freq: Every day | ORAL | 7 refills | Status: DC
Start: 2020-03-27 — End: 2020-11-03

## 2020-03-27 NOTE — Progress Notes (Signed)
  OBSTETRICS POSTPARTUM CLINIC PROGRESS NOTE  Subjective:     Janice Mccall is a 30 y.o. G33P2002 female who presents for a postpartum visit. She is 6 weeks postpartum following a Term pregnancy and delivery by Vaginal, no problems at delivery.  I have fully reviewed the prenatal and intrapartum course. Anesthesia: epidural.  Postpartum course has been complicated by uncomplicated.  Baby is feeding by Bottle.  Bleeding: patient has not  resumed menses.  Bowel function is normal. Bladder function is normal.  Patient is not sexually active. Contraception method desired is tubal ligation.  Postpartum depression screening: negative. Edinburgh 8.  Some anxiety  The following portions of the patient's history were reviewed and updated as appropriate: allergies, current medications, past family history, past medical history, past social history, past surgical history and problem list.  Review of Systems Pertinent items are noted in HPI.  Objective:    BP 120/80   Ht 5\' 3"  (1.6 m)   Wt 230 lb (104.3 kg)   BMI 40.74 kg/m   General:  alert and no distress   Breasts:  inspection negative, no nipple discharge or bleeding, no masses or nodularity palpable  Lungs: clear to auscultation bilaterally  Heart:  regular rate and rhythm, S1, S2 normal, no murmur, click, rub or gallop  Abdomen: soft, non-tender; bowel sounds normal; no masses,  no organomegaly. Well healed Pfannenstiel incision   Vulva:  normal  Vagina: normal vagina, no discharge, exudate, lesion, or erythema  Cervix:  no cervical motion tenderness and no lesions  Corpus: normal size, contour, position, consistency, mobility, non-tender  Adnexa:  normal adnexa and no mass, fullness, tenderness  Rectal Exam: Not performed.          Assessment:  Post Partum Care visit PPD S/p BTL  Plan:  See orders and Patient Instructions Follow up in: 12 months or as needed.   Cont Zoloft 100 mg daily  , MD, Annamarie Major  Ob/Gyn, [facility] 03/27/2020  3:30 PM

## 2020-03-31 LAB — CYTOLOGY - PAP: Diagnosis: NEGATIVE

## 2020-05-15 ENCOUNTER — Telehealth: Payer: Self-pay

## 2020-05-15 NOTE — Telephone Encounter (Signed)
Pt calling; is having heavy bleeding.  [phone]  LM for pt to call b/c her msg was muffled.  Pt returned call; is saturating a pad every to an hour.  Adv per our policy that she go to the ED.

## 2020-07-07 ENCOUNTER — Telehealth: Payer: Self-pay

## 2020-07-07 NOTE — Telephone Encounter (Signed)
Patient is scheduled for 07/14/20 at 1:50 with [facility]

## 2020-07-07 NOTE — Telephone Encounter (Signed)
Pt calling; had surgery 03/13/20; c/o cramping ever since; what to do?  [phone]  Pt states the cramping comes and goes; at times it's worse than at other times; had to leave work the other day.  Pt aware PH not in office today but is in tomorrow.  Pt opt for msg to be sent to him and she would like to sched appt.  Will send msg for front desk to call her to schedule.

## 2020-07-14 ENCOUNTER — Ambulatory Visit: Payer: Medicaid Other | Admitting: Obstetrics & Gynecology

## 2020-07-22 ENCOUNTER — Other Ambulatory Visit: Payer: Self-pay | Admitting: Obstetrics & Gynecology

## 2020-07-22 ENCOUNTER — Telehealth: Payer: Self-pay

## 2020-07-22 DIAGNOSIS — R102 Pelvic and perineal pain: Secondary | ICD-10-CM

## 2020-07-22 NOTE — Telephone Encounter (Signed)
Pt called triage line stating she is in a lot of pain. It is in the area on her right side. She had a tubal ligation in May and says the pain is in that area.  Pt aware she had an appointment on 9/20 and did not show. There are no openings on [facility] schedule until next week. I advised her she may need to be seen at the ER, because it would not be related to surgery pain this far out. She wants [facility] to advise.

## 2020-07-22 NOTE — Telephone Encounter (Signed)
Tried calling patient to get her set up for a GYN u/s and f/u with [facility] the week of 10/4-10/8.

## 2020-07-24 NOTE — Telephone Encounter (Signed)
Patient is scheduled for 08/04/20 °

## 2020-07-24 NOTE — Telephone Encounter (Signed)
Voicemail box not set up unable to leave message for patient to call back to be scheduled. 

## 2020-08-04 ENCOUNTER — Other Ambulatory Visit: Payer: Self-pay | Admitting: Obstetrics & Gynecology

## 2020-08-04 ENCOUNTER — Ambulatory Visit (INDEPENDENT_AMBULATORY_CARE_PROVIDER_SITE_OTHER): Payer: Medicaid Other

## 2020-08-04 ENCOUNTER — Ambulatory Visit (INDEPENDENT_AMBULATORY_CARE_PROVIDER_SITE_OTHER): Payer: Medicaid Other | Admitting: Obstetrics & Gynecology

## 2020-08-04 ENCOUNTER — Other Ambulatory Visit: Payer: Self-pay

## 2020-08-04 ENCOUNTER — Encounter: Payer: Self-pay | Admitting: Obstetrics & Gynecology

## 2020-08-04 VITALS — BP 120/80 | Ht 64.0 in | Wt 239.0 lb

## 2020-08-04 DIAGNOSIS — R197 Diarrhea, unspecified: Secondary | ICD-10-CM

## 2020-08-04 DIAGNOSIS — R102 Pelvic and perineal pain: Secondary | ICD-10-CM

## 2020-08-04 NOTE — Progress Notes (Signed)
HPI: Abdominal Pain Patient is a 29 yo G2P2 AA F who presents for evaluation of abdominal pain. The pain is described as aching and colicky, and is moderate/in intensity. Pain is located in the suprapubic and deep pelvis area with radiation to the lower back on rare occasions. Onset was ongoing occurring 5 months ago, noted after her pregnancy, delivery and then laparoscopic tubal ligation.  Symptoms have been unchanged since. Aggravating factors: none. Alleviating factors: none. Associated symptoms: diarrhea, nausea and vomiting. The patient denies constipation and fever. Risk factors for pelvic/abdominal pain include prior pelvic surgery.   Ultrasound demonstrates no masses seen, no cysts, no swelling or abscess around tubal sites Prior laparoscopy reviewed.  No sign of endometriosis or abnormality then.  PMHx: She  has a past medical history of Obesity, Postpartum depression (2019), Preeclampsia in postpartum period (2019), Pregnancy induced hypertension, and Thyroid condition. Also,  has a past surgical history that includes Umbilical hernia repair; Hernia repair; and Laparoscopic bilateral salpingectomy (Bilateral, 03/13/2020)., family history includes Hypertension in her maternal grandmother and paternal grandmother.,  reports that she has quit smoking. Her smoking use included cigarettes. She smoked 0.50 packs per day. She has never used smokeless tobacco. She reports that she does not drink alcohol and does not use drugs.  She has a current medication list which includes the following prescription(s): acetaminophen, sertraline, and [DISCONTINUED] prochlorperazine. Also, is allergic to prochlorperazine.  Review of Systems  Constitutional: Negative for chills, fever and malaise/fatigue.  HENT: Negative for congestion, sinus pain and sore throat.   Eyes: Negative for blurred vision and pain.  Respiratory: Negative for cough and wheezing.   Cardiovascular: Negative for chest pain and leg  swelling.  Gastrointestinal: Negative for abdominal pain, constipation, diarrhea, heartburn, nausea and vomiting.  Genitourinary: Negative for dysuria, frequency, hematuria and urgency.  Musculoskeletal: Negative for back pain, joint pain, myalgias and neck pain.  Skin: Negative for itching and rash.  Neurological: Negative for dizziness, tremors and weakness.  Endo/Heme/Allergies: Does not bruise/bleed easily.  Psychiatric/Behavioral: Negative for depression. The patient is not nervous/anxious and does not have insomnia.   All other systems reviewed and are negative.   Objective: BP 120/80   Ht 5\' 4"  (1.626 m)   Wt 239 lb (108.4 kg)   BMI 41.02 kg/m   Physical examination Constitutional NAD, Conversant  Skin No rashes, lesions or ulceration.   Extremities: Moves all appropriately.  Normal ROM for age. No lymphadenopathy.  Neuro: Grossly intact  Psych: Oriented to PPT.  Normal mood. Normal affect.   PELVIS TRANSVAGINAL NON-OB (TV ONLY)  Result Date: 08/04/2020 Patient Name: GESENIA BANTZ DOB: Mar 05, 1991 MRN: [id number] ULTRASOUND REPORT Location: [facility] Date of Service: 08/04/2020 Indications:Pelvic Pain Findings: The uterus is anteverted and measures 8.6 x 5.7 x 4.4 cm. Echo texture is homogenous without evidence of focal masses. The Endometrium measures 3.4 mm. Right Ovary measures 3.1 x 1.9 x 2.2 cm. It is normal in appearance. Left Ovary measures 3.0 x 2.3 x 1.7 cm. It is normal in appearance. Survey of the adnexa demonstrates no adnexal masses. There is no free fluid in the cul de sac. Impression: 1. Normal pelvic ultrasound. Recommendations: 1.Clinical correlation with the patient's History and Physical Exam. 10/04/2020, RT Review of ULTRASOUND.    I have personally reviewed images and report of recent ultrasound done at [facility].    Plan of management to be discussed with patient. [facility], MD, Annamarie Major Ob/Gyn, [facility] 08/04/2020  4:31 PM   Assessment:  Diarrhea in adult patient Pelvic pain  - Plan: Ambulatory referral to Gastroenterology  No sign of GYN etiology, and w low likelihood of endometriosis, do not believe additional surgery necessary.  Pain is all month and in fact worse when NOT on periods.  Not sexually active, so no knowledge of dyspareunia.  Hormone therapy such as birth control may help but has low index of prediction of success.  I would like GI to see and evaluate first, esp w n/v/d for so long.  A total of 30 minutes were spent face-to-face with the patient as well as preparation, review, communication, and documentation during this encounter.   Annamarie Major, MD, Merlinda Frederick Ob/Gyn, [facility] 08/04/2020  5:13 PM

## 2020-09-15 ENCOUNTER — Ambulatory Visit: Payer: Self-pay | Admitting: Gastroenterology

## 2020-11-03 ENCOUNTER — Encounter: Payer: Self-pay | Admitting: Gastroenterology

## 2020-11-03 ENCOUNTER — Other Ambulatory Visit: Payer: Self-pay

## 2020-11-03 ENCOUNTER — Ambulatory Visit (INDEPENDENT_AMBULATORY_CARE_PROVIDER_SITE_OTHER): Payer: Medicaid Other | Admitting: Gastroenterology

## 2020-11-03 ENCOUNTER — Encounter: Payer: Self-pay | Admitting: *Deleted

## 2020-11-03 VITALS — BP 111/71 | HR 83 | Temp 97.7°F | Ht 64.0 in | Wt 220.6 lb

## 2020-11-03 DIAGNOSIS — R109 Unspecified abdominal pain: Secondary | ICD-10-CM

## 2020-11-03 DIAGNOSIS — K5900 Constipation, unspecified: Secondary | ICD-10-CM

## 2020-11-03 MED ORDER — POLYETHYLENE GLYCOL 3350 17 G PO PACK: 17.0000 g | PACK | Freq: Every day | ORAL | 0 refills | Status: AC

## 2020-11-03 NOTE — Progress Notes (Signed)
[name] [address]  [address]  [place], [place] [address]  Main: [phone]  Fax: [phone]   Gastroenterology Consultation  Referring Provider:     Nadara Mustard, MD Primary Care Physician:  [facility], [place] Reason for Consultation:     Abdominal pain        HPI:    Chief Complaint  Patient presents with  . Abdominal cramping and diarrhea    Janice Mccall is a 30 y.o. y/o female referred for consultation & management  by [facility]  Patient reports bilateral lower quadrant abdominal pain for years.  Dull, five or 10, nonradiating.  No nausea or vomiting or weight loss.  Does have some exacerbation after meals, but is present without meals as well.  Does report chronic constipation as well.  Has not been on any daily medications for constipation.  No blood in stool.  No dysphagia.  No prior EGD or colonoscopy.  No family history of colon cancer. Past Medical History:  Diagnosis Date  . Obesity   . Postpartum depression 2019  . Preeclampsia in postpartum period 2019   received magnesium sulfate  . Pregnancy induced hypertension   . Thyroid condition     Past Surgical History:  Procedure Laterality Date  . HERNIA REPAIR    . LAPAROSCOPIC BILATERAL SALPINGECTOMY Bilateral 03/13/2020   Procedure: LAPAROSCOPIC BILATERAL SALPINGECTOMY;  Surgeon: Nadara Mustard, MD;  Location: [facility] ORS;  Service: Gynecology;  Laterality: Bilateral;  . UMBILICAL HERNIA REPAIR      Prior to Admission medications   Medication Sig Start Date End Date Taking? Authorizing Provider  polyethylene glycol (MIRALAX) 17 g packet Take 17 g by mouth daily. 11/03/20 02/01/21 Yes Pasty Spillers, MD  acetaminophen (TYLENOL) 500 MG tablet Take 1,000 mg by mouth every 6 (six) hours as needed for moderate pain.  Patient not taking: Reported on 11/03/2020    [provider]  prochlorperazine (COMPAZINE) 10 MG tablet Take 1 tablet (10 mg  total) by mouth every 6 (six) hours as needed for nausea or vomiting. 08/17/19 08/20/19  Oswaldo Conroy, CNM    Family History  Problem Relation Age of Onset  . Hypertension Maternal Grandmother   . Hypertension Paternal Grandmother      Social History   Tobacco Use  . Smoking status: Current Every Day Smoker    Packs/day: 0.50    Types: Cigarettes  . Smokeless tobacco: Never Used  Vaping Use  . Vaping Use: Never used  Substance Use Topics  . Alcohol use: No    Comment: OCC  . Drug use: Never    Allergies as of 11/03/2020 - Review Complete 11/03/2020  Allergen Reaction Noted  . Prochlorperazine Swelling 08/20/2019    Review of Systems:    All systems reviewed and negative except where noted in HPI.   Physical Exam:  BP 111/71   Pulse 83   Temp 97.7 F (36.5 C) (Oral)   Ht 5\' 4"  (1.626 m)   Wt 220 lb 9.6 oz (100.1 kg)   BMI 37.87 kg/m  No LMP recorded. Psych:  Alert and cooperative. Normal mood and affect. General:   Alert,  Well-developed, well-nourished, pleasant and cooperative in NAD Head:  Normocephalic and atraumatic. Eyes:  Sclera clear, no icterus.   Conjunctiva pink. Ears:  Normal auditory acuity. Nose:  No deformity, discharge, or lesions. Mouth:  No deformity or lesions,oropharynx pink & moist. Neck:  Supple; no masses or  thyromegaly. Abdomen:  Normal bowel sounds.  No bruits.  Soft, tender to mild palpation diffusely, no signs of peritonitis, and non-distended without masses, hepatosplenomegaly or hernias noted.  No guarding or rebound tenderness.    Msk:  Symmetrical without gross deformities. Good, equal movement & strength bilaterally. Pulses:  Normal pulses noted. Extremities:  No clubbing or edema.  No cyanosis. Neurologic:  Alert and oriented x3;  grossly normal neurologically. Skin:  Intact without significant lesions or rashes. No jaundice. Lymph Nodes:  No significant cervical adenopathy. Psych:  Alert and cooperative. Normal mood and  affect.   Labs: CBC    Component Value Date/Time   WBC 7.4 03/11/2020 1020   RBC 4.14 03/11/2020 1020   HGB 12.4 03/11/2020 1020   HGB 11.1 12/07/2019 0924   HCT 37.9 03/11/2020 1020   HCT 31.5 (L) 12/07/2019 0924   PLT 167 03/11/2020 1020   PLT 188 12/07/2019 0924   MCV 91.5 03/11/2020 1020   MCV 91 12/07/2019 0924   MCH 30.0 03/11/2020 1020   MCHC 32.7 03/11/2020 1020   RDW 12.4 03/11/2020 1020   RDW 12.8 12/07/2019 0924   LYMPHSABS 2.7 02/17/2020 1254   LYMPHSABS 2.0 12/07/2019 0924   MONOABS 0.5 02/17/2020 1254   EOSABS 0.2 02/17/2020 1254   EOSABS 0.3 12/07/2019 0924   BASOSABS 0.0 02/17/2020 1254   BASOSABS 0.0 12/07/2019 0924   CMP     Component Value Date/Time   NA 139 03/11/2020 1020   NA 137 08/03/2019 1002   K 4.4 03/11/2020 1020   CL 106 03/11/2020 1020   CO2 25 03/11/2020 1020   GLUCOSE 89 03/11/2020 1020   BUN 13 03/11/2020 1020   BUN 11 08/03/2019 1002   CREATININE 0.70 03/11/2020 1020   CALCIUM 9.4 03/11/2020 1020   PROT 6.4 (L) 02/17/2020 1254   PROT 6.3 08/03/2019 1002   ALBUMIN 2.9 (L) 02/17/2020 1254   ALBUMIN 3.8 (L) 08/03/2019 1002   AST 29 02/17/2020 1254   ALT 40 02/17/2020 1254   ALKPHOS 114 02/17/2020 1254   BILITOT 0.8 02/17/2020 1254   BILITOT 0.5 08/03/2019 1002   GFRNONAA >60 03/11/2020 1020   GFRAA >60 03/11/2020 1020    Imaging Studies: No results found.  Assessment and Plan:   ANNER Mccall is a 30 y.o. y/o female has been referred for abdominal pain  Patient symptoms are likely due to her underlying constipation Start with MiraLAX daily to help regulate bowel movements with goal of 1-2 soft bowel movements a day  High-fiber diet MiraLAX daily with goal of 1-2 soft bowel movements daily.  If not at goal, patient instructed to increase dose to twice daily.  If loose stools with the medication, patient asked to decrease the medication to every other day, or half dose daily.  Patient verbalized understanding  Return  to clinic in 6 to 8 weeks to reassess symptoms but if bowel movements not at goal and symptoms not better in 2 to 3 weeks patient advised to call us let us know and she verbalized understanding  Labs reassuring.  No indication for endoscopy at this time   Dr [name]  Speech recognition software was used to dictate the above note.

## 2020-12-15 IMAGING — DX DG KNEE 1-2V*L*
2 series · 2 of 2 positions shown · non-contrast
Comparison: No prior.

CLINICAL DATA: Left knee pain after fall.

EXAM:
LEFT KNEE - 1-2 VIEW

[knee ap]
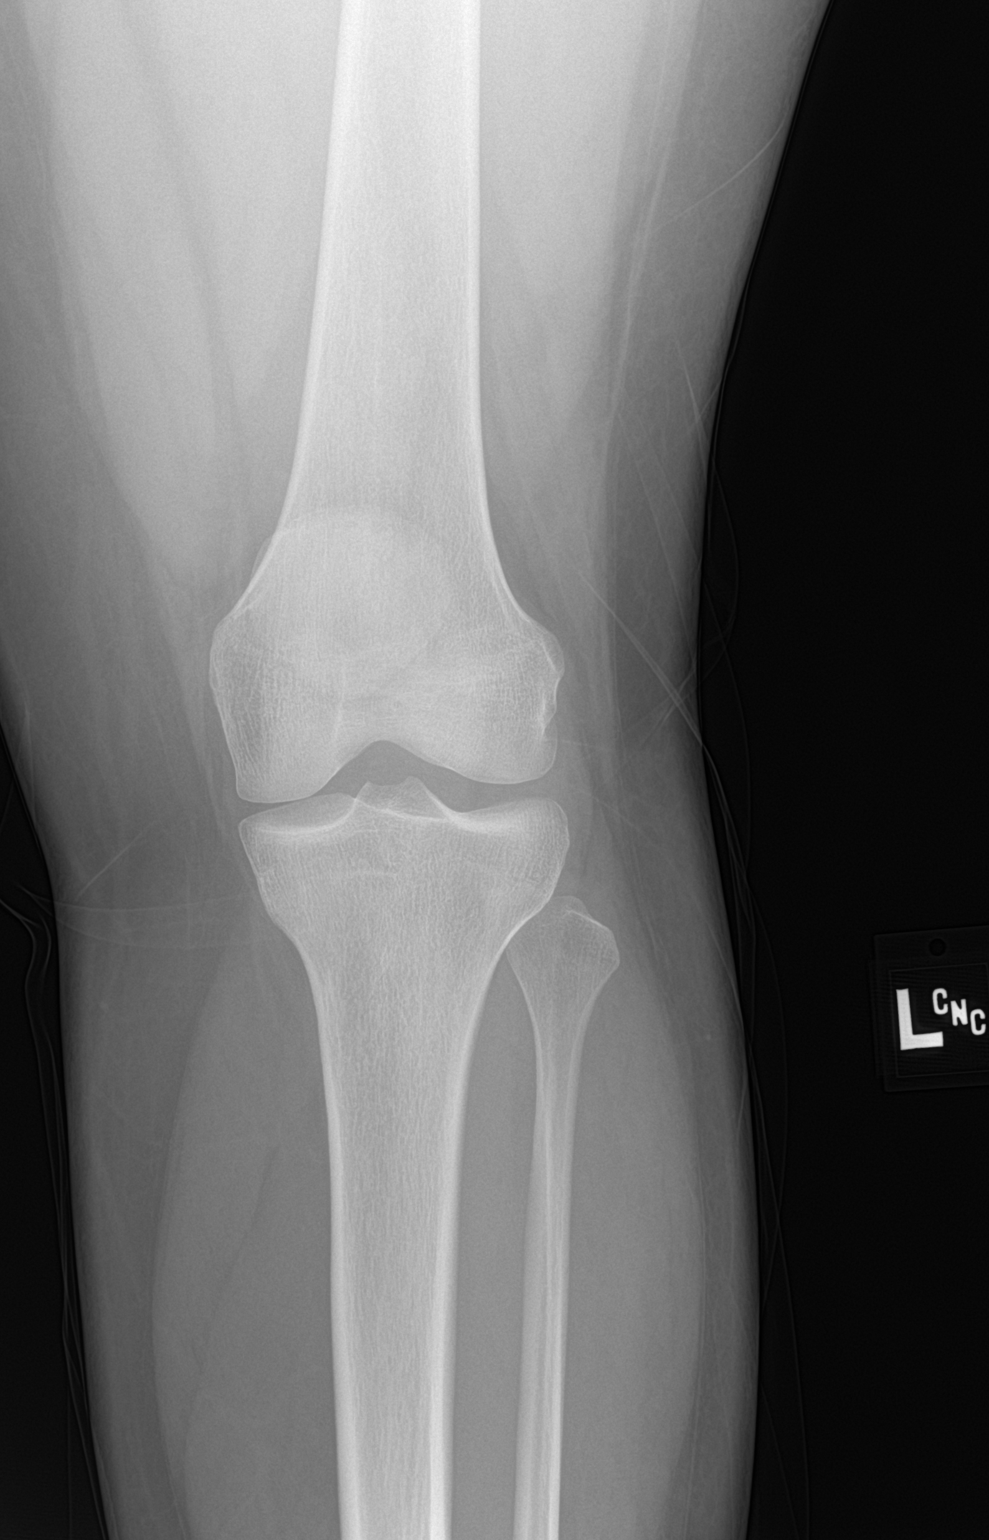

[knee lat]
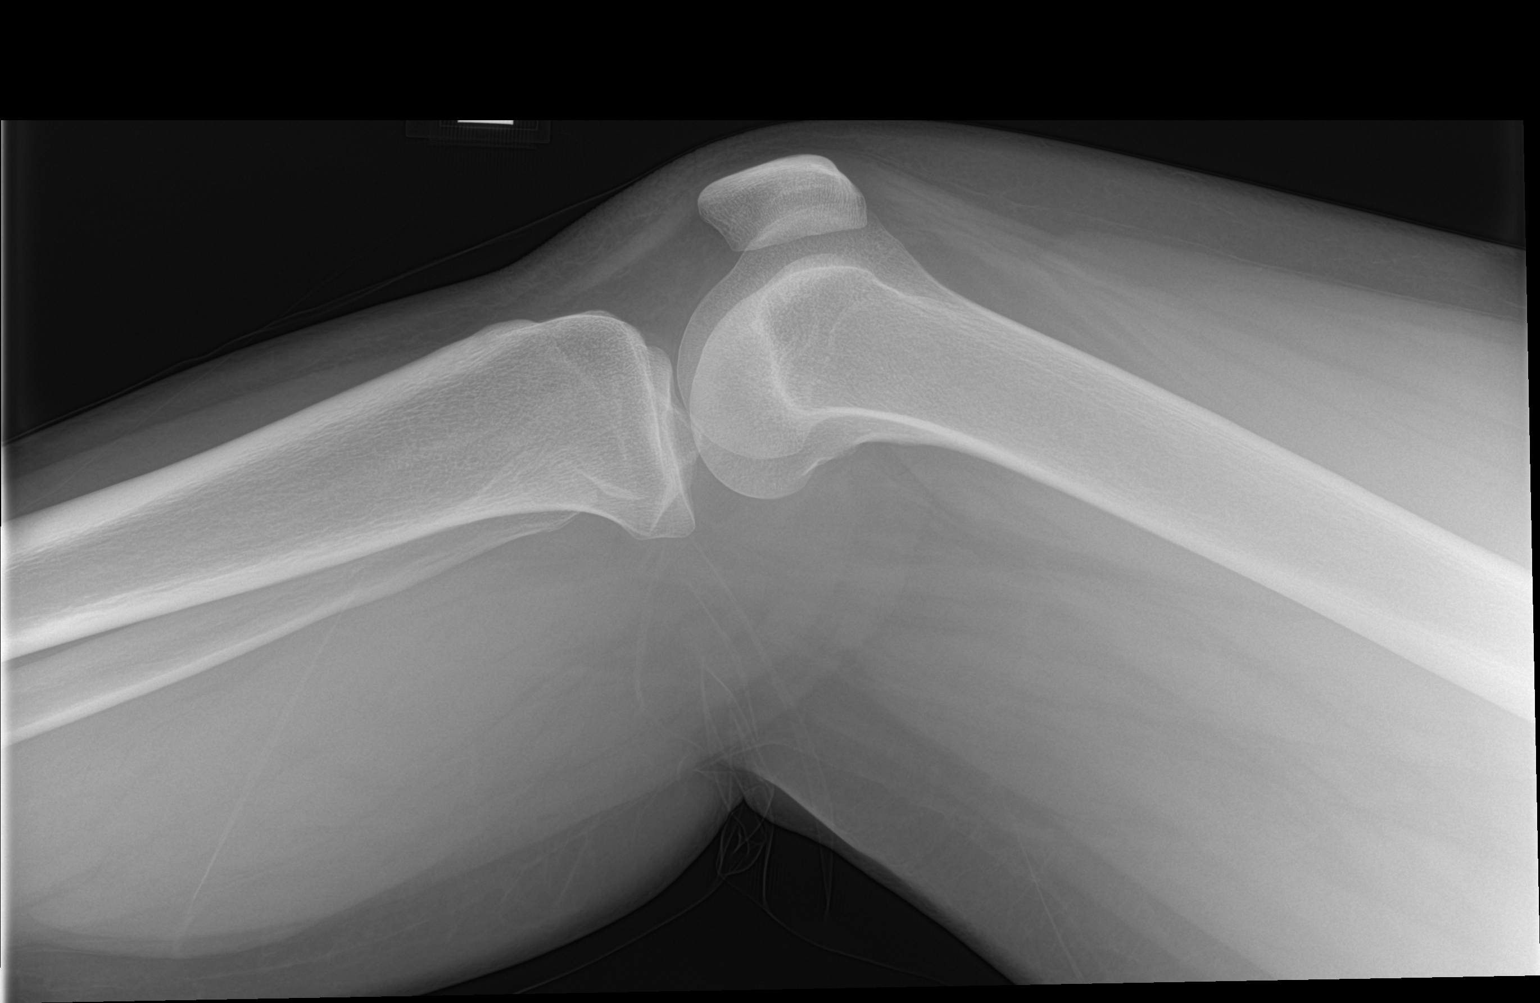

[2 of 2 positions shown; findings below may reference images not displayed]

FINDINGS: No acute bony or joint abnormality. No evidence of fracture or
dislocation.
IMPRESSION: No acute abnormality.

## 2020-12-29 ENCOUNTER — Other Ambulatory Visit: Payer: Self-pay

## 2020-12-29 ENCOUNTER — Telehealth: Payer: Medicaid Other | Admitting: Gastroenterology

## 2021-06-03 IMAGING — CT CT HEAD W/O CM
3 series · 16 of 47 positions shown, 19 images · non-contrast
Comparison: No pertinent prior studies available for comparison.

CLINICAL DATA: Dizziness with syncope, pregnant, evaluate for SHAHANA;
syncope, simple, abnormal neuro exam. Additional history provided:
History of preeclampsia, patient reports severe headache, nausea,
back pain and abdominal pain.

EXAM:
CT HEAD WITHOUT CONTRAST
TECHNIQUE: Contiguous axial images were obtained from the base of the skull
through the vertex without intravenous contrast.

[Series 2: head wo · axial · 0.43mm/px · z∈[-163,-38]mm · 10 of 31 slices shown, 13 images]
[im 3/31  brain]
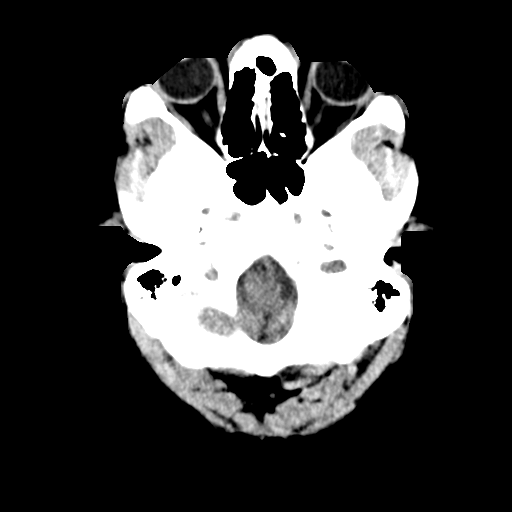
[im 3/31  bone]
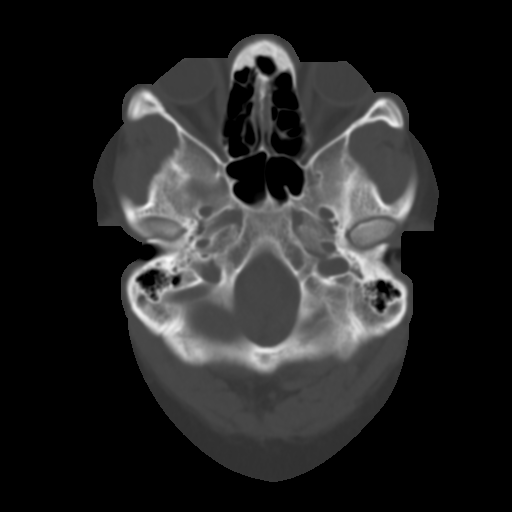
[im 6/31  brain]
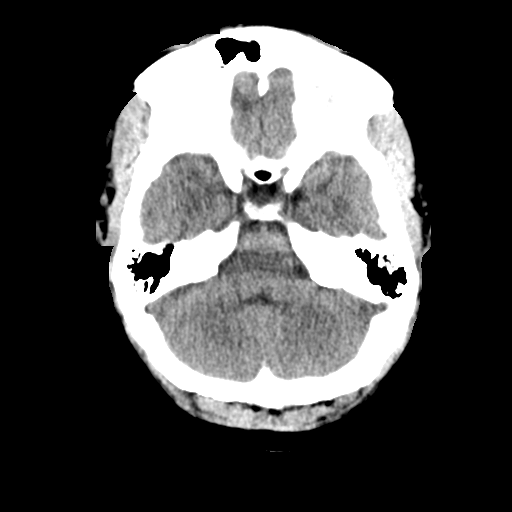
[im 9/31  brain]
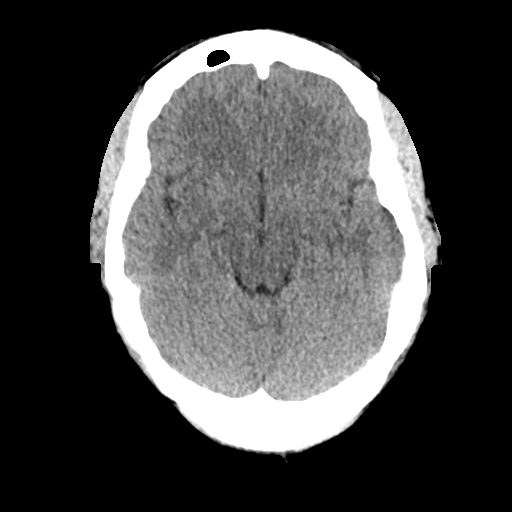
[im 11/31  brain]
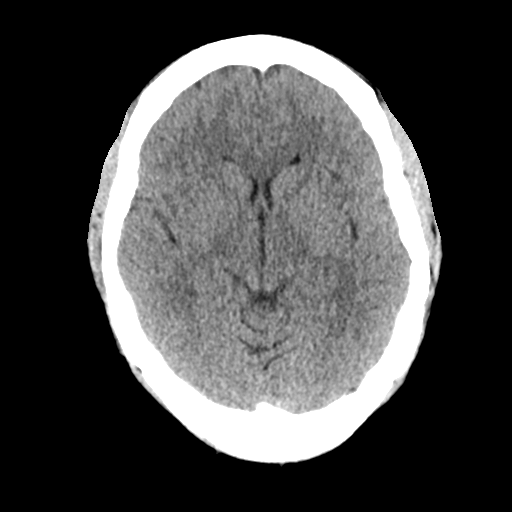
[im 14/31  brain]
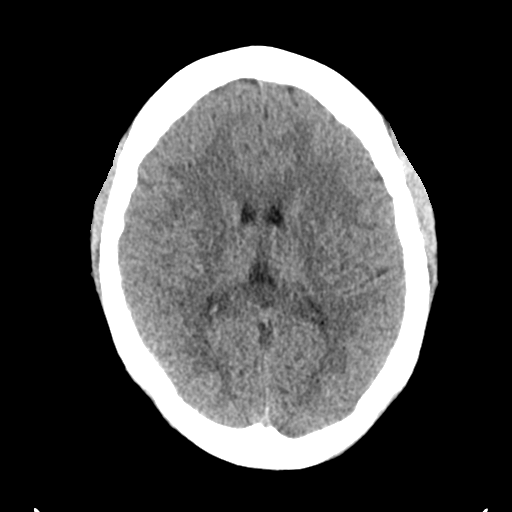
[im 14/31  bone]
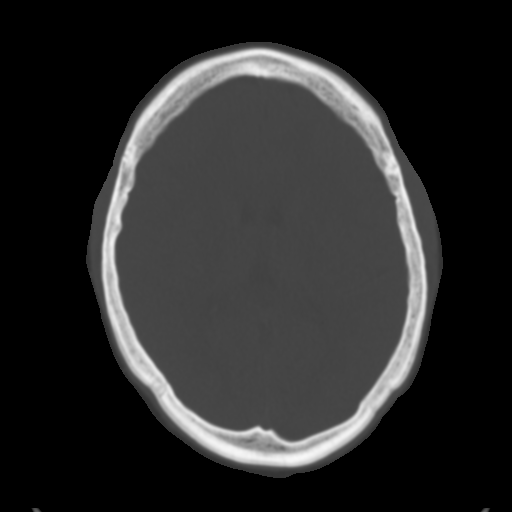
[im 17/31  brain]
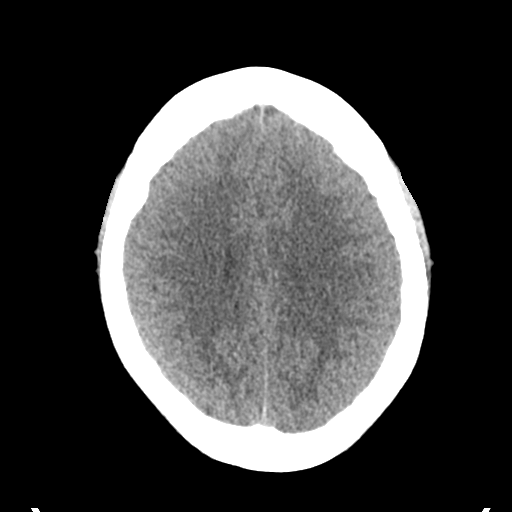
[im 20/31  brain]
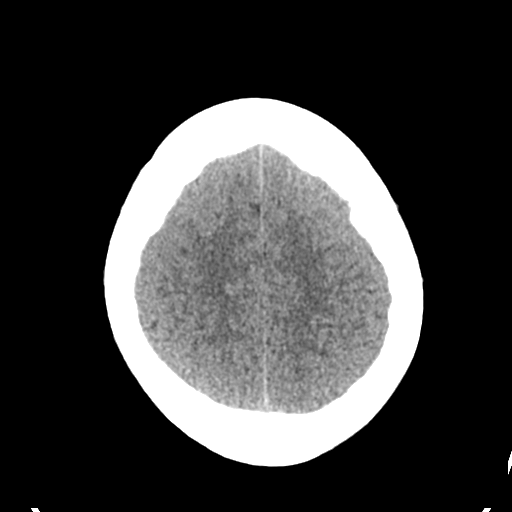
[im 23/31  brain]
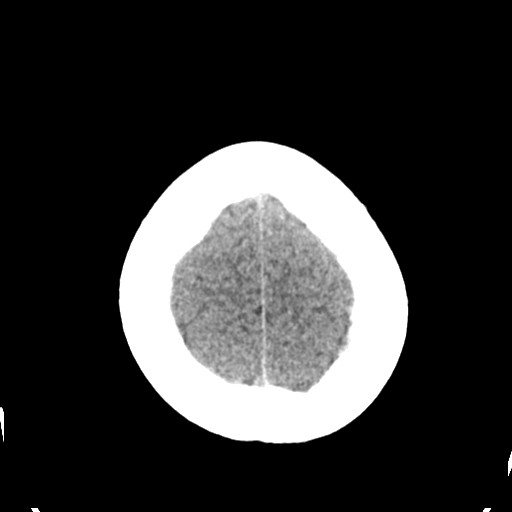
[im 25/31  brain]
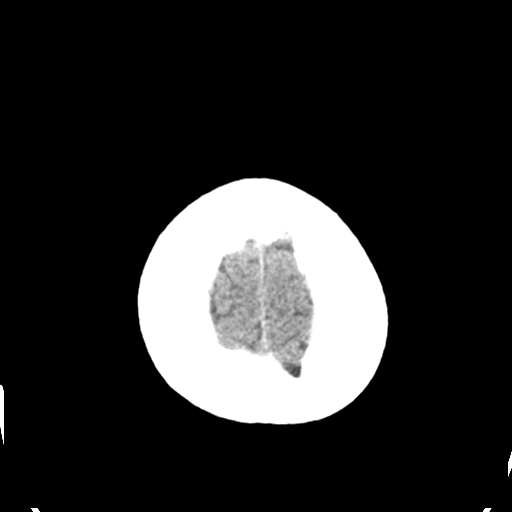
[im 25/31  bone]
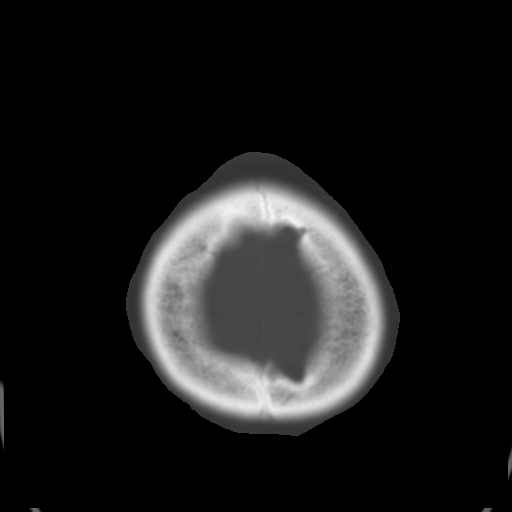
[im 28/31  brain]
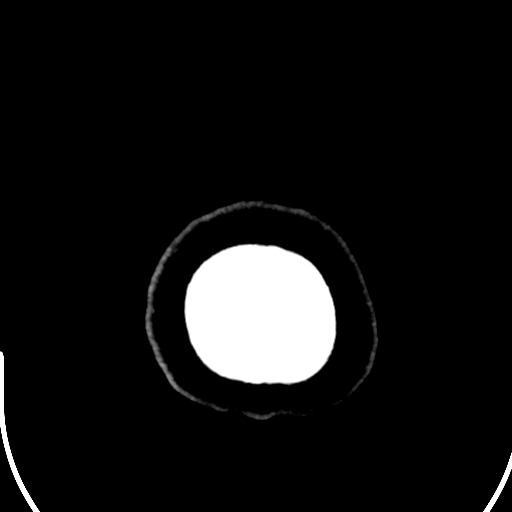

[Series 4: coronal soft tissue · coronal · 0.33mm/px · 3 of 69 slices shown]
[im 23/69  brain]
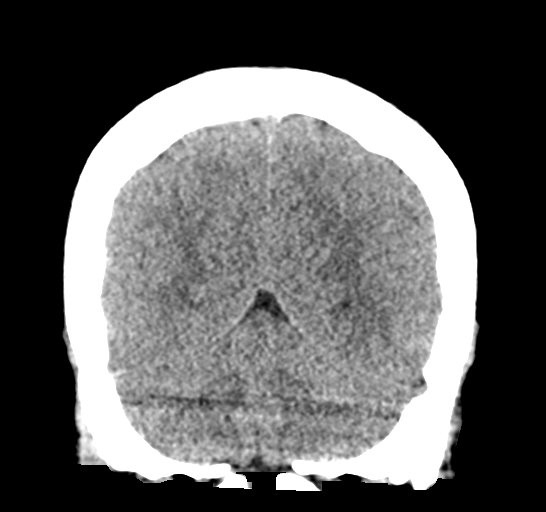
[im 31/69  brain]
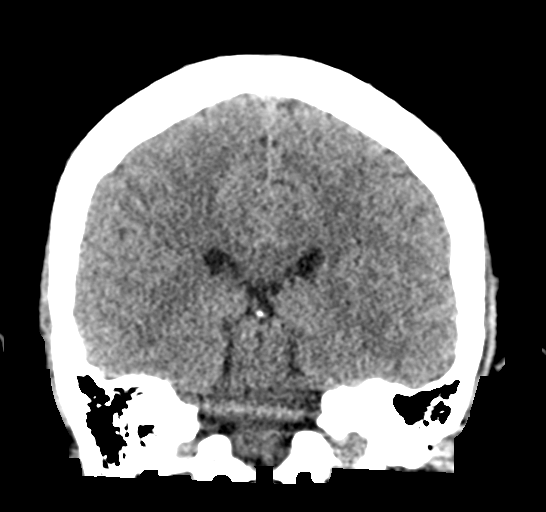
[im 38/69  brain]
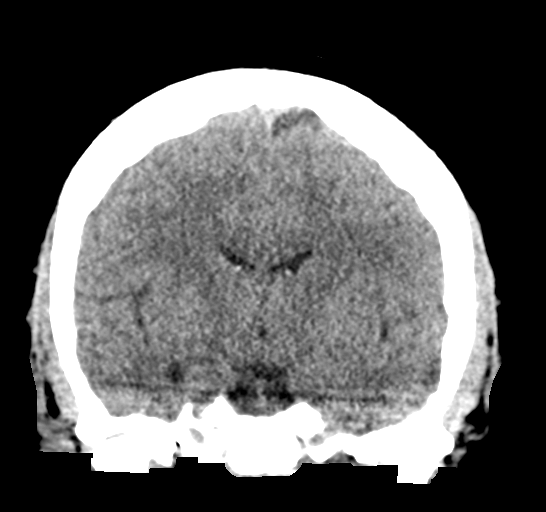

[Series 5: sagittal soft tissue · sagittal · 0.33mm/px · 3 of 58 slices shown]
[im 20/58  brain]
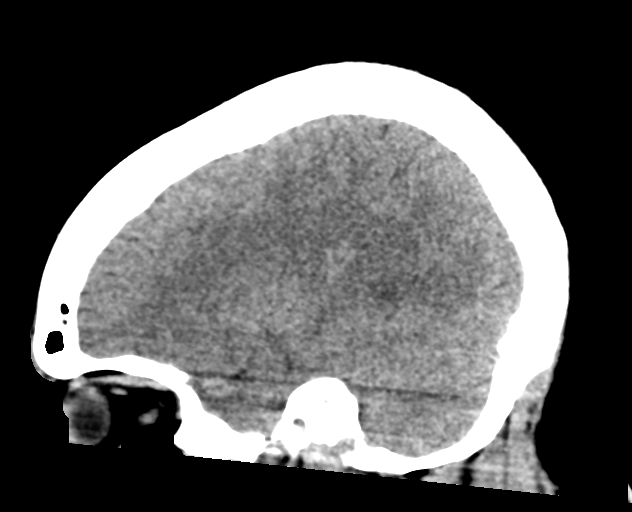
[im 29/58  brain]
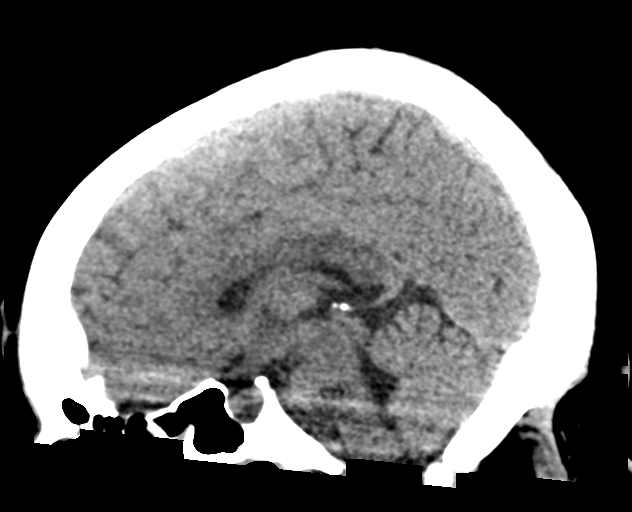
[im 39/58  brain]
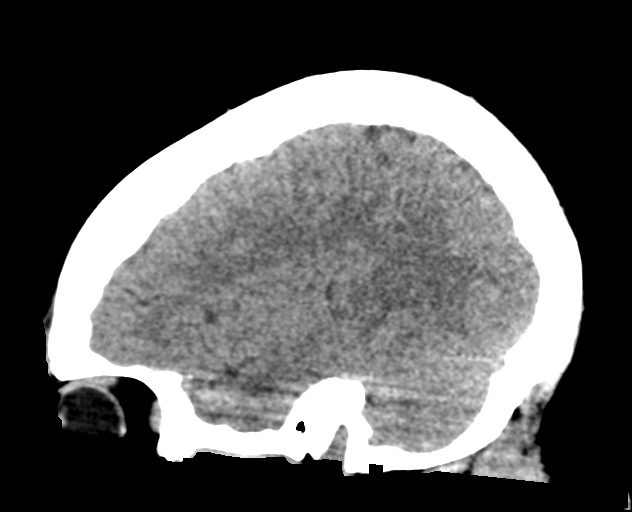

[16 of 47 positions shown; findings below may reference images not displayed]

FINDINGS: Brain: There is no evidence of acute intracranial hemorrhage,
intracranial mass, midline shift or extra-axial fluid collection.No
demarcated cortical infarction.

Vascular: No hyperdense vessel.

Skull: Normal. Negative for fracture or focal lesion.

Sinuses/Orbits: Mild ethmoid sinus mucosal thickening.
IMPRESSION: Unremarkable non-contrast CT appearance of the brain. No evidence of
acute intracranial abnormality.
